# Patient Record
Sex: Female | Born: 1960 | Race: White | Hispanic: No | Smoking: Former smoker
Health system: Southern US, Community
[De-identification: ages and names within clinical notes are randomized; demographics above are authoritative.]

## PROBLEM LIST (undated history)

## (undated) DIAGNOSIS — I10 Essential (primary) hypertension: Secondary | ICD-10-CM

## (undated) DIAGNOSIS — E119 Type 2 diabetes mellitus without complications: Secondary | ICD-10-CM

## (undated) DIAGNOSIS — E079 Disorder of thyroid, unspecified: Secondary | ICD-10-CM

## (undated) HISTORY — PX: CHOLECYSTECTOMY: SHX55

## (undated) HISTORY — PX: ABDOMINAL HYSTERECTOMY: SHX81

---

## 2000-07-18 ENCOUNTER — Emergency Department (HOSPITAL_COMMUNITY): Admission: EM | Admit: 2000-07-18 | Discharge: 2000-07-19 | Payer: Self-pay | Admitting: *Deleted

## 2000-07-19 ENCOUNTER — Encounter: Payer: Self-pay | Admitting: *Deleted

## 2000-07-20 ENCOUNTER — Inpatient Hospital Stay (HOSPITAL_COMMUNITY): Admission: EM | Admit: 2000-07-20 | Discharge: 2000-07-23 | Payer: Self-pay | Admitting: *Deleted

## 2000-07-20 ENCOUNTER — Encounter: Payer: Self-pay | Admitting: *Deleted

## 2000-07-20 ENCOUNTER — Encounter: Payer: Self-pay | Admitting: General Surgery

## 2000-07-21 ENCOUNTER — Encounter: Payer: Self-pay | Admitting: General Surgery

## 2002-04-11 ENCOUNTER — Emergency Department (HOSPITAL_COMMUNITY): Admission: EM | Admit: 2002-04-11 | Discharge: 2002-04-11 | Payer: Self-pay | Admitting: Emergency Medicine

## 2003-03-12 ENCOUNTER — Emergency Department (HOSPITAL_COMMUNITY): Admission: EM | Admit: 2003-03-12 | Discharge: 2003-03-12 | Payer: Self-pay | Admitting: Emergency Medicine

## 2003-07-18 ENCOUNTER — Emergency Department (HOSPITAL_COMMUNITY): Admission: EM | Admit: 2003-07-18 | Discharge: 2003-07-18 | Payer: Self-pay | Admitting: Emergency Medicine

## 2003-09-12 ENCOUNTER — Ambulatory Visit (HOSPITAL_COMMUNITY): Admission: RE | Admit: 2003-09-12 | Discharge: 2003-09-12 | Payer: Self-pay | Admitting: *Deleted

## 2003-10-10 ENCOUNTER — Ambulatory Visit (HOSPITAL_COMMUNITY): Admission: RE | Admit: 2003-10-10 | Discharge: 2003-10-10 | Payer: Self-pay | Admitting: *Deleted

## 2003-12-28 ENCOUNTER — Emergency Department (HOSPITAL_COMMUNITY): Admission: EM | Admit: 2003-12-28 | Discharge: 2003-12-28 | Payer: Self-pay

## 2004-01-05 ENCOUNTER — Emergency Department (HOSPITAL_COMMUNITY): Admission: EM | Admit: 2004-01-05 | Discharge: 2004-01-05 | Payer: Self-pay | Admitting: Emergency Medicine

## 2004-03-05 ENCOUNTER — Emergency Department (HOSPITAL_COMMUNITY): Admission: EM | Admit: 2004-03-05 | Discharge: 2004-03-05 | Payer: Self-pay | Admitting: Emergency Medicine

## 2004-08-24 ENCOUNTER — Emergency Department (HOSPITAL_COMMUNITY): Admission: EM | Admit: 2004-08-24 | Discharge: 2004-08-24 | Payer: Self-pay | Admitting: Emergency Medicine

## 2005-02-09 ENCOUNTER — Emergency Department (HOSPITAL_COMMUNITY): Admission: EM | Admit: 2005-02-09 | Discharge: 2005-02-09 | Payer: Self-pay | Admitting: Emergency Medicine

## 2005-08-15 ENCOUNTER — Emergency Department (HOSPITAL_COMMUNITY): Admission: EM | Admit: 2005-08-15 | Discharge: 2005-08-15 | Payer: Self-pay | Admitting: Emergency Medicine

## 2006-09-05 ENCOUNTER — Emergency Department (HOSPITAL_COMMUNITY): Admission: EM | Admit: 2006-09-05 | Discharge: 2006-09-06 | Payer: Self-pay | Admitting: Emergency Medicine

## 2007-05-28 ENCOUNTER — Emergency Department (HOSPITAL_COMMUNITY): Admission: EM | Admit: 2007-05-28 | Discharge: 2007-05-28 | Payer: Self-pay | Admitting: Emergency Medicine

## 2007-06-15 ENCOUNTER — Ambulatory Visit (HOSPITAL_COMMUNITY): Admission: RE | Admit: 2007-06-15 | Discharge: 2007-06-15 | Payer: Self-pay | Admitting: Family Medicine

## 2007-08-15 ENCOUNTER — Emergency Department (HOSPITAL_COMMUNITY): Admission: EM | Admit: 2007-08-15 | Discharge: 2007-08-15 | Payer: Self-pay | Admitting: Emergency Medicine

## 2008-04-22 ENCOUNTER — Emergency Department (HOSPITAL_COMMUNITY): Admission: EM | Admit: 2008-04-22 | Discharge: 2008-04-22 | Payer: Self-pay | Admitting: Emergency Medicine

## 2008-08-04 ENCOUNTER — Emergency Department (HOSPITAL_COMMUNITY): Admission: EM | Admit: 2008-08-04 | Discharge: 2008-08-04 | Payer: Self-pay | Admitting: Emergency Medicine

## 2008-08-07 ENCOUNTER — Emergency Department (HOSPITAL_COMMUNITY): Admission: EM | Admit: 2008-08-07 | Discharge: 2008-08-07 | Payer: Self-pay | Admitting: Emergency Medicine

## 2008-10-01 ENCOUNTER — Emergency Department (HOSPITAL_COMMUNITY): Admission: EM | Admit: 2008-10-01 | Discharge: 2008-10-01 | Payer: Self-pay | Admitting: Emergency Medicine

## 2008-11-07 ENCOUNTER — Emergency Department (HOSPITAL_COMMUNITY): Admission: EM | Admit: 2008-11-07 | Discharge: 2008-11-08 | Payer: Self-pay | Admitting: Emergency Medicine

## 2009-05-31 ENCOUNTER — Emergency Department (HOSPITAL_COMMUNITY): Admission: EM | Admit: 2009-05-31 | Discharge: 2009-05-31 | Payer: Self-pay | Admitting: Emergency Medicine

## 2009-08-07 ENCOUNTER — Emergency Department (HOSPITAL_COMMUNITY): Admission: EM | Admit: 2009-08-07 | Discharge: 2009-08-07 | Payer: Self-pay | Admitting: Emergency Medicine

## 2009-11-04 ENCOUNTER — Ambulatory Visit (HOSPITAL_COMMUNITY): Admission: RE | Admit: 2009-11-04 | Discharge: 2009-11-04 | Payer: Self-pay | Admitting: Obstetrics & Gynecology

## 2010-07-21 LAB — CULTURE, ROUTINE-ABSCESS

## 2010-07-26 LAB — URINALYSIS, ROUTINE W REFLEX MICROSCOPIC
Bilirubin Urine: NEGATIVE
Hgb urine dipstick: NEGATIVE
Ketones, ur: NEGATIVE mg/dL
Nitrite: NEGATIVE
Specific Gravity, Urine: >1.03 — ABNORMAL HIGH (ref 1.005–1.030)
Urobilinogen, UA: 0.2 mg/dL (ref 0.0–1.0)
pH: 5.5 (ref 5.0–8.0)

## 2010-07-26 LAB — URINE CULTURE
Colony Count: NO GROWTH
Culture: NO GROWTH

## 2010-07-26 LAB — URINE MICROSCOPIC-ADD ON

## 2011-07-31 ENCOUNTER — Emergency Department (HOSPITAL_COMMUNITY)
Admission: EM | Admit: 2011-07-31 | Discharge: 2011-07-31 | Disposition: A | Discharge status: Home / Self Care | Payer: Medicare Other | Attending: Emergency Medicine | Admitting: Emergency Medicine

## 2011-07-31 ENCOUNTER — Encounter (HOSPITAL_COMMUNITY): Payer: Self-pay

## 2011-07-31 DIAGNOSIS — R51 Headache: Secondary | ICD-10-CM

## 2011-07-31 DIAGNOSIS — R059 Cough, unspecified: Secondary | ICD-10-CM | POA: Insufficient documentation

## 2011-07-31 DIAGNOSIS — R05 Cough: Secondary | ICD-10-CM | POA: Insufficient documentation

## 2011-07-31 DIAGNOSIS — G43909 Migraine, unspecified, not intractable, without status migrainosus: Secondary | ICD-10-CM | POA: Insufficient documentation

## 2011-07-31 HISTORY — DX: Disorder of thyroid, unspecified: E07.9

## 2011-07-31 MED ORDER — HYDROCOD POLST-CHLORPHEN POLST 10-8 MG/5ML PO LQCR
5.0000 mL | Freq: Once | ORAL | Status: AC
  Administered 2011-07-31: 5 mL via ORAL
  Filled 2011-07-31: qty 5

## 2011-07-31 MED ORDER — SODIUM CHLORIDE 0.9 % IV BOLUS (SEPSIS)
1000.0000 mL | Freq: Once | INTRAVENOUS | Status: AC
  Administered 2011-07-31: 1000 mL via INTRAVENOUS

## 2011-07-31 MED ORDER — HYDROCOD POLST-CHLORPHEN POLST 10-8 MG/5ML PO LQCR: 5.0000 mL | Freq: Two times a day (BID) | ORAL | Status: DC | PRN

## 2011-07-31 MED ORDER — ONDANSETRON HCL 4 MG/2ML IJ SOLN
4.0000 mg | Freq: Once | INTRAMUSCULAR | Status: AC
  Administered 2011-07-31: 4 mg via INTRAVENOUS
  Filled 2011-07-31: qty 2

## 2011-07-31 MED ORDER — KETOROLAC TROMETHAMINE 30 MG/ML IJ SOLN
30.0000 mg | Freq: Once | INTRAMUSCULAR | Status: AC
  Administered 2011-07-31: 30 mg via INTRAVENOUS
  Filled 2011-07-31: qty 1

## 2011-07-31 MED ORDER — METOCLOPRAMIDE HCL 5 MG/ML IJ SOLN
10.0000 mg | Freq: Once | INTRAMUSCULAR | Status: AC
  Administered 2011-07-31: 10 mg via INTRAVENOUS
  Filled 2011-07-31: qty 2

## 2011-07-31 NOTE — ED Notes (Signed)
Migraine since Friday, vomiting off/on, cough since yesterday, unable to catch breath at times today.  nad in triage

## 2011-07-31 NOTE — ED Notes (Signed)
Patient states she woke up with a migraine on Friday; states took her medications and her migraine medication on Friday.  States vomited several times on Friday.  States she felt ok yesterday, but today developed a cough; states productive cough with yellow phlegm.  A&O; skin w/d. Respirations even and unlabored; able to speak in complete sentences without difficulty.

## 2011-07-31 NOTE — ED Notes (Signed)
Discharge instructions given and reviewed with patient.  Prescription given for Tussionex; effects and use explained.  Patient verbalized understanding of sedating effects of medications.  Patient ambulatory; discharged home in good condition in care of significant other.

## 2011-07-31 NOTE — ED Provider Notes (Addendum)
History     CSN: [id number]  Arrival date & time 07/31/11  [place]   First MD Initiated Contact with Patient 07/31/11 2016      Chief Complaint  Patient presents with  . Migraine  . Cough    (Consider location/radiation/quality/duration/timing/severity/associated sxs/prior treatment) HPI.Marland Kitchen SUBJECTIVE:  Gina Osborne is a 51 y.o. female . She complains of migraine headache intermittently since Friday. Symptoms improved and Saturday but returned today.  Associated symptoms nausea and vomiting. Has a cough. No fever, chills, neuro deficits. This feels like her normal migraine  Current Facility-Administered Medications  Medication Dose Route Frequency Provider Last Rate Last Dose  . chlorpheniramine-HYDROcodone (TUSSIONEX) 10-8 MG/5ML suspension 5 mL  5 mL Oral Once Donnetta Hutching, MD   5 mL at 07/31/11 2126  . ketorolac (TORADOL) 30 MG/ML injection 30 mg  30 mg Intravenous Once Donnetta Hutching, MD   30 mg at 07/31/11 2127  . metoCLOPramide (REGLAN) injection 10 mg  10 mg Intravenous Once Donnetta Hutching, MD   10 mg at 07/31/11 2127  . ondansetron (ZOFRAN) injection 4 mg  4 mg Intravenous Once Donnetta Hutching, MD   4 mg at 07/31/11 2127  . sodium chloride 0.9 % bolus 1,000 mL  1,000 mL Intravenous Once Donnetta Hutching, MD   1,000 mL at 07/31/11 2125   Current Outpatient Prescriptions  Medication Sig Dispense Refill  . chlorpheniramine-HYDROcodone (TUSSIONEX PENNKINETIC ER) 10-8 MG/5ML LQCR Take 5 mLs by mouth every 12 (twelve) hours as needed.  120 mL  0    There are no associated abnormal neurological symptoms such as TIA's, loss of balance, loss of vision or speech, numbness or weakness on review. Past neurological history: negative for stroke, MS, epilepsy, or brain tumor.   OBJECTIVE:  Patient appears in pain, preferring to lie in a darkened room. Her vitals are normal. Alert and oriented x 3.  Ears and throat normal. Neck fully supple without nodes. Sinuses non tender. Cranial nerves are normal.  Fundi are  normal with sharp disc margins, no papilledema, hemorrhages or exudates noted. DTR's normal and symmetric. Babinski sign absent.  Mental status normal. Cerebellar function normal.   ASSESSMENT:  Migraine headache  PLAN:  Treatment today -  see orders as documented in the electronic medical record. ROV prn if pain does not resolve after treatment. Headache intermittently since Friday.  Past Medical History  Diagnosis Date  . Migraine   . Thyroid disease     Past Surgical History  Procedure Date  . Abdominal hysterectomy     No family history on file.  History  Substance Use Topics  . Smoking status: Never Smoker   . Smokeless tobacco: Not on file  . Alcohol Use: No    OB History    Grav Para Term Preterm Abortions TAB SAB Ect Mult Living                  Review of Systems  All other systems reviewed and are negative.    Allergies  Review of patient's allergies indicates not on file.  Home Medications   Current Outpatient Rx  Name Route Sig Dispense Refill  . HYDROCOD POLST-CPM POLST ER 10-8 MG/5ML PO LQCR Oral Take 5 mLs by mouth every 12 (twelve) hours as needed. 120 mL 0    BP 143/88  Pulse 64  Temp(Src) 97.6 F (36.4 C) (Oral)  Resp 20  Ht 5\' 3"  (1.6 m)  Wt 165 lb (74.844 kg)  BMI 29.23 kg/m2  SpO2  100%  Physical Exam  Nursing note and vitals reviewed. Constitutional: She is oriented to person, place, and time. She appears well-developed and well-nourished.  HENT:  Head: Normocephalic and atraumatic.  Eyes: Conjunctivae and EOM are normal. Pupils are equal, round, and reactive to light.  Neck: Normal range of motion. Neck supple.  Cardiovascular: Normal rate and regular rhythm.   Pulmonary/Chest: Effort normal and breath sounds normal.  Abdominal: Soft. Bowel sounds are normal.  Musculoskeletal: Normal range of motion.  Neurological: She is alert and oriented to person, place, and time.  Skin: Skin is warm and dry.  Psychiatric: She has a  normal mood and affect.    ED Course  Procedures (including critical care time)  Labs Reviewed - No data to display No results found.   1. Headache   2. Cough       MDM  IV fluids, medications for pain and nausea.   Recheck at 2200: Feeling much better. Discharge home with Tussionex     Donnetta Hutching, MD 07/31/11 2209  Donnetta Hutching, MD 07/31/11 2212

## 2011-07-31 NOTE — Discharge Instructions (Signed)
Increase fluids. Take your normal medications. Prescription for cough medicine for

## 2012-06-24 ENCOUNTER — Emergency Department (HOSPITAL_COMMUNITY)
Admission: EM | Admit: 2012-06-24 | Discharge: 2012-06-24 | Disposition: A | Discharge status: Home / Self Care | Payer: Medicare Other | Attending: Emergency Medicine | Admitting: Emergency Medicine

## 2012-06-24 ENCOUNTER — Encounter (HOSPITAL_COMMUNITY): Payer: Self-pay | Admitting: Emergency Medicine

## 2012-06-24 DIAGNOSIS — G43909 Migraine, unspecified, not intractable, without status migrainosus: Secondary | ICD-10-CM | POA: Insufficient documentation

## 2012-06-24 DIAGNOSIS — E079 Disorder of thyroid, unspecified: Secondary | ICD-10-CM | POA: Insufficient documentation

## 2012-06-24 DIAGNOSIS — B0239 Other herpes zoster eye disease: Secondary | ICD-10-CM | POA: Insufficient documentation

## 2012-06-24 DIAGNOSIS — R21 Rash and other nonspecific skin eruption: Secondary | ICD-10-CM | POA: Insufficient documentation

## 2012-06-24 DIAGNOSIS — B0230 Zoster ocular disease, unspecified: Secondary | ICD-10-CM

## 2012-06-24 DIAGNOSIS — Z79899 Other long term (current) drug therapy: Secondary | ICD-10-CM | POA: Insufficient documentation

## 2012-06-24 MED ORDER — VALACYCLOVIR HCL 500 MG PO TABS: 1000.0000 mg | ORAL_TABLET | Freq: Every day | ORAL | Status: DC

## 2012-06-24 MED ORDER — VALACYCLOVIR HCL 1 G PO TABS: 1000.0000 mg | ORAL_TABLET | Freq: Every day | ORAL | Status: DC

## 2012-06-24 MED ORDER — VALACYCLOVIR HCL 500 MG PO TABS
1000.0000 mg | ORAL_TABLET | Freq: Once | ORAL | Status: AC
  Administered 2012-06-24: 1000 mg via ORAL
  Filled 2012-06-24 (×2): qty 2

## 2012-06-24 MED ORDER — OXYCODONE-ACETAMINOPHEN 5-325 MG PO TABS
1.0000 | ORAL_TABLET | Freq: Once | ORAL | Status: AC
  Administered 2012-06-24: 1 via ORAL
  Filled 2012-06-24: qty 1

## 2012-06-24 MED ORDER — OXYCODONE-ACETAMINOPHEN 5-325 MG PO TABS: 1.0000 | ORAL_TABLET | ORAL | Status: DC | PRN

## 2012-06-24 MED ORDER — TETRACAINE HCL 0.5 % OP SOLN
1.0000 [drp] | Freq: Once | OPHTHALMIC | Status: AC
  Administered 2012-06-24: 1 [drp] via OPHTHALMIC
  Filled 2012-06-24: qty 2

## 2012-06-24 NOTE — ED Notes (Signed)
Pt c/o red bump and swelling over left eye since Friday. Denies visual changes.

## 2012-06-24 NOTE — ED Provider Notes (Signed)
History     CSN: [id number]  Arrival date & time 06/24/12  1403   First MD Initiated Contact with Patient 06/24/12 1526      Chief Complaint  Patient presents with  . Abscess    (Consider location/radiation/quality/duration/timing/severity/associated sxs/prior treatment) HPI Comments: Gina Osborne is a 52 y.o. Female with painful rash above her left eye which broke out 2 days ago, although she describes having pain across her forehead for a few days prior to the onset of the rash.  It is described as burning and pain,  Constant without radiation of pain.  The rash looked just like red patches but today she noticed blisters forming within the reddened areas.  She denies fevers and chills and has had no visual disturbance and no eye pain.  She has taken no pain relievers or applied any topical medicines to the area.      The history is provided by the patient and the spouse.    Past Medical History  Diagnosis Date  . Migraine   . Thyroid disease     Past Surgical History  Procedure Laterality Date  . Abdominal hysterectomy      No family history on file.  History  Substance Use Topics  . Smoking status: Never Smoker   . Smokeless tobacco: Not on file  . Alcohol Use: No    OB History   Grav Para Term Preterm Abortions TAB SAB Ect Mult Living                  Review of Systems  Constitutional: Negative for fever and chills.  HENT: Negative for facial swelling.   Respiratory: Negative for shortness of breath and wheezing.   Skin: Positive for rash.  Neurological: Negative for numbness.    Allergies  Darvocet; Influenza vaccines; Mushroom ext cmplx(shiitake-reishi-mait); and Chocolate  Home Medications   Current Outpatient Rx  Name  Route  Sig  Dispense  Refill  . albuterol (PROVENTIL) 2 MG tablet   Oral   Take 2 mg by mouth 3 (three) times daily.         Marland Kitchen levothyroxine (SYNTHROID, LEVOTHROID) 50 MCG tablet   Oral   Take 50 mcg by mouth at bedtime.           . phentermine (ADIPEX-P) 37.5 MG tablet   Oral   Take 37.5 mg by mouth daily.         . rizatriptan (MAXALT-MLT) 10 MG disintegrating tablet   Oral   Take 10 mg by mouth as needed for migraine. May repeat in 2 hours if needed         . topiramate (TOPAMAX) 100 MG tablet   Oral   Take 100 mg by mouth at bedtime.         Marland Kitchen zolpidem (AMBIEN) 5 MG tablet   Oral   Take 5 mg by mouth at bedtime.         Marland Kitchen oxyCODONE-acetaminophen (PERCOCET/ROXICET) 5-325 MG per tablet   Oral   Take 1 tablet by mouth every 4 (four) hours as needed for pain.   20 tablet   0   . valACYclovir (VALTREX) 1000 MG tablet   Oral   Take 1 tablet (1,000 mg total) by mouth 5 (five) times daily.   45 tablet   0     BP 115/82  Pulse 76  Temp(Src) 97.9 F (36.6 C) (Oral)  Resp 17  Ht 5\' 4"  (1.626 m)  Wt 160 lb (72.576  kg)  BMI 27.45 kg/m2  SpO2 100%  Physical Exam  Constitutional: She appears well-developed and well-nourished. No distress.  HENT:  Head: Normocephalic.  Eyes: EOM and lids are normal. Pupils are equal, round, and reactive to light. Right eye exhibits no discharge and no exudate. Left eye exhibits no discharge and no exudate.  Slit lamp exam:      The left eye shows fluorescein uptake.  Small focus of prominent scleral blood vessels laterally.  Possible early dendrite medial cornea, vertical at 3 oclock position.  No hutchinsons sign.  Neck: Neck supple.  Cardiovascular: Normal rate.   Pulmonary/Chest: Effort normal. She has no wheezes.  Musculoskeletal: Normal range of motion. She exhibits no edema.  Skin: Rash noted.  Approximate dime sized slightly raised erythematous macule at lateral left eyebrow with several intact small vesicles.  Smaller macule medial eyebrow.  Skin just above the brow slightly erythematous without overt rash.    ED Course  Procedures (including critical care time)  Labs Reviewed - No data to display No results found.   1. Herpes zoster  ophthalmicus     Slit lamp exam was performed by Dr Hyacinth Meeker.  MDM  Spoke with Dr. Lita Mains,  Will see pt in his office tomorrow.  Pt to call for appt.  Time.  Recommended starting her on valcyclovir oral.  He will add valcyclovir ophth drops tomorrow if needed.  Pt was given valacyclovir 1 gram prior to dc home.  She was also given another valcyclovir 1 gram to be taken at home before bedtime as her pharmacy is currently closed.  Also prescribed oxycodone for pain relief.        Burgess Amor, PA-C 06/24/12 1713  Burgess Amor, PA-C 06/26/12 [phone]

## 2012-06-27 NOTE — ED Provider Notes (Signed)
Pt with rash on the L face, on my exam c/w herpes, on my exam with tetracaine and slit lamp has corneal abnormlaity could be c/w zoster opthalmicus - PA Idol d/w optho - will see next day  Medical screening examination/treatment/procedure(s) were conducted as a shared visit with non-physician practitioner(s) and myself.  I personally evaluated the patient during the encounter    Vida Roller, MD 06/27/12 [phone]

## 2013-07-17 ENCOUNTER — Telehealth: Payer: Self-pay

## 2013-07-17 NOTE — Telephone Encounter (Signed)
Pt stated that she did not want to have a TCS. She was referred by Dr. Sherwood GamblerFusco

## 2013-07-17 NOTE — Telephone Encounter (Signed)
Letter to PCP that pt does not wish to schedule colonoscopy at this time.

## 2013-07-17 NOTE — Telephone Encounter (Signed)
Open in error

## 2014-02-28 ENCOUNTER — Other Ambulatory Visit (HOSPITAL_COMMUNITY): Payer: Self-pay | Admitting: Internal Medicine

## 2014-02-28 DIAGNOSIS — Z1231 Encounter for screening mammogram for malignant neoplasm of breast: Secondary | ICD-10-CM

## 2014-03-05 ENCOUNTER — Ambulatory Visit (HOSPITAL_COMMUNITY)
Admission: RE | Admit: 2014-03-05 | Discharge: 2014-03-05 | Disposition: A | Discharge status: Home / Self Care | Payer: Medicare HMO | Source: Ambulatory Visit | Attending: Internal Medicine | Admitting: Internal Medicine

## 2014-03-05 DIAGNOSIS — Z1231 Encounter for screening mammogram for malignant neoplasm of breast: Secondary | ICD-10-CM

## 2014-05-30 DIAGNOSIS — E063 Autoimmune thyroiditis: Secondary | ICD-10-CM | POA: Diagnosis not present

## 2014-05-30 DIAGNOSIS — E119 Type 2 diabetes mellitus without complications: Secondary | ICD-10-CM | POA: Diagnosis not present

## 2014-05-30 DIAGNOSIS — Z6829 Body mass index (BMI) 29.0-29.9, adult: Secondary | ICD-10-CM | POA: Diagnosis not present

## 2014-05-30 DIAGNOSIS — J45909 Unspecified asthma, uncomplicated: Secondary | ICD-10-CM | POA: Diagnosis not present

## 2014-07-02 DIAGNOSIS — H5210 Myopia, unspecified eye: Secondary | ICD-10-CM | POA: Diagnosis not present

## 2014-07-02 DIAGNOSIS — E119 Type 2 diabetes mellitus without complications: Secondary | ICD-10-CM | POA: Diagnosis not present

## 2014-07-24 DIAGNOSIS — E119 Type 2 diabetes mellitus without complications: Secondary | ICD-10-CM | POA: Diagnosis not present

## 2014-08-15 DIAGNOSIS — G43909 Migraine, unspecified, not intractable, without status migrainosus: Secondary | ICD-10-CM | POA: Diagnosis not present

## 2014-08-15 DIAGNOSIS — R1310 Dysphagia, unspecified: Secondary | ICD-10-CM | POA: Diagnosis not present

## 2014-08-15 DIAGNOSIS — G4700 Insomnia, unspecified: Secondary | ICD-10-CM | POA: Diagnosis not present

## 2014-08-15 DIAGNOSIS — E063 Autoimmune thyroiditis: Secondary | ICD-10-CM | POA: Diagnosis not present

## 2014-08-15 DIAGNOSIS — Z6829 Body mass index (BMI) 29.0-29.9, adult: Secondary | ICD-10-CM | POA: Diagnosis not present

## 2014-08-15 DIAGNOSIS — E119 Type 2 diabetes mellitus without complications: Secondary | ICD-10-CM | POA: Diagnosis not present

## 2014-08-15 DIAGNOSIS — E663 Overweight: Secondary | ICD-10-CM | POA: Diagnosis not present

## 2014-08-15 DIAGNOSIS — Z23 Encounter for immunization: Secondary | ICD-10-CM | POA: Diagnosis not present

## 2014-08-18 ENCOUNTER — Other Ambulatory Visit (HOSPITAL_COMMUNITY): Payer: Self-pay | Admitting: Internal Medicine

## 2014-08-18 DIAGNOSIS — R142 Eructation: Secondary | ICD-10-CM

## 2014-08-20 ENCOUNTER — Ambulatory Visit (HOSPITAL_COMMUNITY)
Admission: RE | Admit: 2014-08-20 | Discharge: 2014-08-20 | Disposition: A | Discharge status: Home / Self Care | Payer: Commercial Managed Care - HMO | Source: Ambulatory Visit | Attending: Internal Medicine | Admitting: Internal Medicine

## 2014-08-20 DIAGNOSIS — K449 Diaphragmatic hernia without obstruction or gangrene: Secondary | ICD-10-CM | POA: Insufficient documentation

## 2014-08-20 DIAGNOSIS — R1310 Dysphagia, unspecified: Secondary | ICD-10-CM | POA: Insufficient documentation

## 2014-08-20 DIAGNOSIS — R142 Eructation: Secondary | ICD-10-CM

## 2014-08-21 ENCOUNTER — Other Ambulatory Visit (HOSPITAL_COMMUNITY): Payer: Medicare HMO

## 2014-09-01 DIAGNOSIS — Z6831 Body mass index (BMI) 31.0-31.9, adult: Secondary | ICD-10-CM | POA: Diagnosis not present

## 2014-09-01 DIAGNOSIS — E119 Type 2 diabetes mellitus without complications: Secondary | ICD-10-CM | POA: Diagnosis not present

## 2014-09-01 DIAGNOSIS — F419 Anxiety disorder, unspecified: Secondary | ICD-10-CM | POA: Diagnosis not present

## 2014-09-01 DIAGNOSIS — K219 Gastro-esophageal reflux disease without esophagitis: Secondary | ICD-10-CM | POA: Diagnosis not present

## 2014-09-01 DIAGNOSIS — E6609 Other obesity due to excess calories: Secondary | ICD-10-CM | POA: Diagnosis not present

## 2014-09-01 DIAGNOSIS — R1310 Dysphagia, unspecified: Secondary | ICD-10-CM | POA: Diagnosis not present

## 2014-09-01 DIAGNOSIS — E063 Autoimmune thyroiditis: Secondary | ICD-10-CM | POA: Diagnosis not present

## 2014-09-19 ENCOUNTER — Encounter: Payer: Self-pay | Admitting: Nurse Practitioner

## 2014-09-29 ENCOUNTER — Encounter: Payer: Self-pay | Admitting: Nurse Practitioner

## 2014-09-29 ENCOUNTER — Other Ambulatory Visit: Payer: Self-pay

## 2014-09-29 ENCOUNTER — Ambulatory Visit (INDEPENDENT_AMBULATORY_CARE_PROVIDER_SITE_OTHER): Payer: Commercial Managed Care - HMO | Admitting: Nurse Practitioner

## 2014-09-29 VITALS — BP 123/76 | HR 74 | Temp 97.6°F | Ht 63.0 in | Wt 168.0 lb

## 2014-09-29 DIAGNOSIS — R1310 Dysphagia, unspecified: Secondary | ICD-10-CM | POA: Diagnosis not present

## 2014-09-29 DIAGNOSIS — R933 Abnormal findings on diagnostic imaging of other parts of digestive tract: Secondary | ICD-10-CM | POA: Insufficient documentation

## 2014-09-29 NOTE — Assessment & Plan Note (Signed)
The 54 year old female with solid food and liquid dysphagia. Was set up for a barium pill esophagram by PCP which found 5 cm hiatal hernia, mucosal irregularity just above the hernia, possible ulcerations just above the hernia. She was placed on a PPI as well as Carafate and her symptoms of dysphagia seems to have resolved for now. We'll proceed with EGD to further evaluate for possible esophageal irregularity or changes. Return for follow-up in 3 months  Proceed with EGD with Dr. Darrick Penna in near future: the risks, benefits, and alternatives have been discussed with the patient in detail. The patient states understanding and desires to proceed.  The patient is not on any anticoagulants. She is on Ambien for sleep. We'll provide for 12.5 mg of Phenergan. Denies alcohol or drug use.

## 2014-09-29 NOTE — Patient Instructions (Signed)
1. We will schedule your procedure for you today. 2. Further recommendations to be based on the results of your procedure.

## 2014-09-29 NOTE — Progress Notes (Signed)
cc'd to pcp 

## 2014-09-29 NOTE — Progress Notes (Signed)
Primary Care Physician:  Cassell Smiles., MD Primary Gastroenterologist:  Dr. Darrick Penna  Chief Complaint  Patient presents with  . swallowing issues.    HPI:   54 year old female presents on referral from PCP for BPE abnormality. History of GERD stated well controlled. BPE completed 08/20/14 showed 5 cm hiatal hernia, mucosal irregularity and possible ulcerations in the distal esophagus just above the hernia. No stricture. Is currently on Protonix 40 mg daily and Carafate. Was referred this year for TCS which the patient refused per telephone note.  Today she states she doesn't think she has a history of GERD, but does have a history of dysphagia which she has had for about 2 months but feels it has resolved. Symptoms were with solid food, denies pill dysphagia. Also has symptoms of liquids "feeling like they're going to bubble back up." Has been taking Protonix and carafate with symptoms improvement. Denies abdominal pain, N/V, hematemesis, melena, hematochezia. Has a bowel movement daily which is consistent with [facility] Scale 4. Denies chest pain, dyspnea, dizziness, lightheadedness, syncope, near syncope. Denies any other upper or lower GI symptoms.   Past Medical History  Diagnosis Date  . Migraine   . Thyroid disease     Past Surgical History  Procedure Laterality Date  . Abdominal hysterectomy      Current Outpatient Prescriptions  Medication Sig Dispense Refill  . atorvastatin (LIPITOR) 10 MG tablet     . CARAFATE 1 GM/10ML suspension     . Cetirizine HCl 10 MG CAPS Take by mouth.    . levothyroxine (SYNTHROID, LEVOTHROID) 50 MCG tablet Take 50 mcg by mouth at bedtime.     Marland Kitchen lisinopril (PRINIVIL,ZESTRIL) 2.5 MG tablet     . pantoprazole (PROTONIX) 40 MG tablet     . phentermine (ADIPEX-P) 37.5 MG tablet Take 37.5 mg by mouth daily.    Marland Kitchen topiramate (TOPAMAX) 100 MG tablet Take 100 mg by mouth at bedtime.    Marland Kitchen zolpidem (AMBIEN) 5 MG tablet Take 5 mg by mouth at bedtime.      Marland Kitchen albuterol (PROVENTIL) 2 MG tablet Take 2 mg by mouth 3 (three) times daily.    Marland Kitchen oxyCODONE-acetaminophen (PERCOCET/ROXICET) 5-325 MG per tablet Take 1 tablet by mouth every 4 (four) hours as needed for pain. (Patient not taking: Reported on 09/29/2014) 20 tablet 0  . rizatriptan (MAXALT-MLT) 10 MG disintegrating tablet Take 10 mg by mouth as needed for migraine. May repeat in 2 hours if needed    . valACYclovir (VALTREX) 1000 MG tablet Take 1 tablet (1,000 mg total) by mouth 5 (five) times daily. (Patient not taking: Reported on 09/29/2014) 45 tablet 0   No current facility-administered medications for this visit.    Allergies as of 09/29/2014 - Review Complete 09/29/2014  Allergen Reaction Noted  . Darvocet [propoxyphene n-acetaminophen]  08/04/2011  . Influenza vaccines  08/04/2011  . Mushroom ext cmplx(shiitake-reishi-mait) Hives and Swelling 08/04/2011  . Chocolate Rash 08/04/2011    No family history on file.  History   Social History  . Marital Status: Single    Spouse Name: N/A  . Number of Children: N/A  . Years of Education: N/A   Occupational History  . Not on file.   Social History Main Topics  . Smoking status: Never Smoker   . Smokeless tobacco: Not on file  . Alcohol Use: No  . Drug Use: No  . Sexual Activity: Yes    Birth Control/ Protection: None   Other Topics  Concern  . Not on file   Social History Narrative    Review of Systems: General: Negative for anorexia, weight loss, fever, chills, fatigue, weakness. Eyes: Negative for vision changes.  ENT: Negative for hoarseness, difficulty swallowing. CV: Negative for chest pain, angina, palpitations, peripheral edema.  Respiratory: Negative for dyspnea at rest, cough, wheezing.  GI: See history of present illness. Derm: Negative for rash or itching.  Endo: Negative for unusual weight change.  Heme: Negative for bruising or bleeding. Allergy: Negative for rash or hives.    Physical Exam: BP  123/76 mmHg  Pulse 74  Temp(Src) 97.6 F (36.4 C)  Ht 5\' 3"  (1.6 m)  Wt 168 lb (76.204 kg)  BMI 29.77 kg/m2 General:   Alert and oriented. Pleasant and cooperative. Well-nourished and well-developed.  Head:  Normocephalic and atraumatic. Eyes:  Without icterus, sclera clear and conjunctiva pink.  Ears:  Normal auditory acuity. Cardiovascular:  S1, S2 present without murmurs appreciated. Normal pulses noted. Extremities without clubbing or edema. Respiratory:  Clear to auscultation bilaterally. No wheezes, rales, or rhonchi. No distress.  Gastrointestinal:  +BS, soft, non-tender and non-distended. No HSM noted. No guarding or rebound. No masses appreciated.  Rectal:  Deferred  Neurologic:  Alert and oriented x4;  grossly normal neurologically. Psych:  Alert and cooperative. Normal mood and affect. Heme/Lymph/Immune: No excessive bruising noted.    09/29/2014 9:32 AM

## 2014-09-29 NOTE — Assessment & Plan Note (Signed)
54 year old female with a history of dysphagia of solid foods and occasionally liquids. Patient had a barium pill esophagram completed which shows mucosal irregularity just above the 5 cm hiatal hernia along with the possibility of erosions at the same spot. Patient was placed on PPI as well as Carafate and symptoms have subsequently seemed to resolve. Given irregularities found on BPE we'll also proceed with an endoscopy with possible dilation to further evaluate for possibilities of mucosal irregularity and silent GERD ramifications. Return for further evaluation in 3 months.  Proceed with EGD with Dr. Darrick Penna in near future: the risks, benefits, and alternatives have been discussed with the patient in detail. The patient states understanding and desires to proceed.  The patient is not on any anticoagulants. She is on Ambien for sleep. We'll provide for 12.5 mg of Phenergan. Denies alcohol or drug use.

## 2014-10-15 NOTE — Progress Notes (Signed)
Pt was going to call us back to set up her EGD+/- dil but has never called us back.

## 2014-10-17 ENCOUNTER — Other Ambulatory Visit: Payer: Self-pay

## 2014-10-17 DIAGNOSIS — R1314 Dysphagia, pharyngoesophageal phase: Secondary | ICD-10-CM

## 2014-10-31 ENCOUNTER — Ambulatory Visit (HOSPITAL_COMMUNITY)
Admission: RE | Admit: 2014-10-31 | Discharge: 2014-10-31 | Disposition: A | Discharge status: Home / Self Care | Payer: Commercial Managed Care - HMO | Source: Ambulatory Visit | Attending: Gastroenterology | Admitting: Gastroenterology

## 2014-10-31 ENCOUNTER — Encounter (HOSPITAL_COMMUNITY)
Admission: RE | Disposition: A | Discharge status: Home / Self Care | Payer: Self-pay | Source: Ambulatory Visit | Attending: Gastroenterology

## 2014-10-31 ENCOUNTER — Encounter (HOSPITAL_COMMUNITY): Payer: Self-pay

## 2014-10-31 DIAGNOSIS — K2950 Unspecified chronic gastritis without bleeding: Secondary | ICD-10-CM | POA: Insufficient documentation

## 2014-10-31 DIAGNOSIS — R1314 Dysphagia, pharyngoesophageal phase: Secondary | ICD-10-CM

## 2014-10-31 DIAGNOSIS — G43909 Migraine, unspecified, not intractable, without status migrainosus: Secondary | ICD-10-CM | POA: Insufficient documentation

## 2014-10-31 DIAGNOSIS — K2970 Gastritis, unspecified, without bleeding: Secondary | ICD-10-CM | POA: Diagnosis not present

## 2014-10-31 DIAGNOSIS — E079 Disorder of thyroid, unspecified: Secondary | ICD-10-CM | POA: Diagnosis not present

## 2014-10-31 DIAGNOSIS — R1310 Dysphagia, unspecified: Secondary | ICD-10-CM

## 2014-10-31 DIAGNOSIS — K222 Esophageal obstruction: Secondary | ICD-10-CM | POA: Diagnosis not present

## 2014-10-31 DIAGNOSIS — K449 Diaphragmatic hernia without obstruction or gangrene: Secondary | ICD-10-CM | POA: Insufficient documentation

## 2014-10-31 DIAGNOSIS — Z79899 Other long term (current) drug therapy: Secondary | ICD-10-CM | POA: Diagnosis not present

## 2014-10-31 HISTORY — PX: SAVORY DILATION: SHX5439

## 2014-10-31 HISTORY — PX: ESOPHAGOGASTRODUODENOSCOPY: SHX5428

## 2014-10-31 SURGERY — EGD (ESOPHAGOGASTRODUODENOSCOPY)
Anesthesia: Moderate Sedation

## 2014-10-31 MED ORDER — MINERAL OIL PO OIL
TOPICAL_OIL | ORAL | Status: AC
  Filled 2014-10-31: qty 30

## 2014-10-31 MED ORDER — MEPERIDINE HCL 100 MG/ML IJ SOLN
INTRAMUSCULAR | Status: AC
  Filled 2014-10-31: qty 2

## 2014-10-31 MED ORDER — PROMETHAZINE HCL 25 MG/ML IJ SOLN: 12.5000 mg | Freq: Once | INTRAMUSCULAR | Status: DC

## 2014-10-31 MED ORDER — MIDAZOLAM HCL 5 MG/5ML IJ SOLN
INTRAMUSCULAR | Status: DC | PRN
  Administered 2014-10-31: 1 mg via INTRAVENOUS
  Administered 2014-10-31 (×2): 2 mg via INTRAVENOUS
  Administered 2014-10-31: 1 mg via INTRAVENOUS

## 2014-10-31 MED ORDER — MIDAZOLAM HCL 5 MG/5ML IJ SOLN
INTRAMUSCULAR | Status: AC
  Filled 2014-10-31: qty 10

## 2014-10-31 MED ORDER — LIDOCAINE VISCOUS 2 % MT SOLN
OROMUCOSAL | Status: AC
  Filled 2014-10-31: qty 15

## 2014-10-31 MED ORDER — LIDOCAINE VISCOUS 2 % MT SOLN
OROMUCOSAL | Status: DC | PRN
  Administered 2014-10-31: 4 mL via OROMUCOSAL

## 2014-10-31 MED ORDER — PANTOPRAZOLE SODIUM 40 MG PO TBEC: DELAYED_RELEASE_TABLET | ORAL | Status: DC

## 2014-10-31 MED ORDER — SODIUM CHLORIDE 0.9 % IV SOLN: INTRAVENOUS | Status: DC

## 2014-10-31 MED ORDER — MEPERIDINE HCL 100 MG/ML IJ SOLN
INTRAMUSCULAR | Status: DC | PRN
  Administered 2014-10-31: 50 mg via INTRAVENOUS
  Administered 2014-10-31 (×2): 25 mg via INTRAVENOUS

## 2014-10-31 NOTE — Op Note (Signed)
[facility] [address] [place], [address]   ENDOSCOPY PROCEDURE REPORT  PATIENT: Gina Osborne, Gina Osborne  MR#: [id number] BIRTHDATE: [birth year] , 53  yrs. old GENDER: female  ENDOSCOPIST: West Bali, MD REFFERED [id number] Sherwood Gambler, M.D.  PROCEDURE DATE:  11-06-14 PROCEDURE:   EGD with dilatation over guidewire and EGD with biopsy   INDICATIONS:1.  dysphagia. MEDICATIONS: Demerol 100 mg IV and Versed 6 mg IV TOPICAL ANESTHETIC: Viscous Xylocaine  DESCRIPTION OF PROCEDURE:   After the risks benefits and alternatives of the procedure were thoroughly explained, informed consent was obtained.  The EG-[id number] ([id number])  endoscope was introduced through the mouth and advanced to the second portion of the duodenum. The instrument was slowly withdrawn as the mucosa was carefully examined.  Prior to withdrawal of the scope, the guidwire was placed.  The esophagus was dilated successfully.  The patient was recovered in endoscopy and discharged home in satisfactory condition. Estimated blood loss is zero unless otherwise noted in this procedure report.   ESOPHAGUS: A stricture was found at the gastroesophageal junction. The stenosis was traversable with the endoscope. STOMACH: A hiatal hernia was found.   Mild non-erosive gastritis (inflammation) was found in the gastric body and gastric antrum. Multiple biopsies were performed.   Dilation was then performed at the gastroesphageal junction  Dilator: Savary over guidewire Size(s): 12.8-16 MM Resistance: minimal Heme: none  COMPLICATIONS: There were no immediate complications.  ENDOSCOPIC IMPRESSION: 1.   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   SMALL Hiatal hernia 3.   MILD Non-erosive gastritis  RECOMMENDATIONS: FOLLOW A LOW FAT DIET. AVOID TRIGGERS FOR REFLUX. PROTONIX 30 MINUTES PRIOR TO A MEAL FOREVER. AWAIT BIOPSY RESULTS. FOLLOW UP IN 3 MOS.   eSigned:  West Bali, MD 06-Nov-2014  12:20 PM   CPT CODES: ICD CODES:  The ICD and CPT codes recommended by this software are interpretations from the data that the clinical staff has captured with the software.  The verification of the translation of this report to the ICD and CPT codes and modifiers is the sole responsibility of the health care institution and practicing physician where this report was generated.  [employer] will not be held responsible for the validity of the ICD and CPT codes included on this report.  AMA assumes no liability for data contained or not contained herein. CPT is a Publishing rights manager of the [employer].

## 2014-10-31 NOTE — H&P (Signed)
  Primary Care Physician:  Cassell Smiles., MD Primary Gastroenterologist:  Dr. Darrick Penna  Pre-Procedure History & Physical: HPI:  Gina Osborne is a 54 y.o. female here for DYSPHAGIA/ABNL BPE MAY 2016.  Past Medical History  Diagnosis Date  . Migraine   . Thyroid disease     Past Surgical History  Procedure Laterality Date  . Abdominal hysterectomy      Prior to Admission medications   Medication Sig Start Date End Date Taking? Authorizing Provider  atorvastatin (LIPITOR) 10 MG tablet Take 10 mg by mouth daily.  08/28/14  Yes Historical Provider, MD  Cetirizine HCl 10 MG CAPS Take 1 capsule by mouth daily.    Yes Historical Provider, MD  levothyroxine (SYNTHROID, LEVOTHROID) 50 MCG tablet Take 50 mcg by mouth daily.    Yes Historical Provider, MD  lisinopril (PRINIVIL,ZESTRIL) 2.5 MG tablet Take 2.5 mg by mouth daily.  08/28/14  Yes Historical Provider, MD  pantoprazole (PROTONIX) 40 MG tablet Take 40 mg by mouth daily.  09/01/14  Yes Historical Provider, MD  phentermine (ADIPEX-P) 37.5 MG tablet Take 37.5 mg by mouth every other day.    Yes Historical Provider, MD  topiramate (TOPAMAX) 100 MG tablet Take 100 mg by mouth at bedtime.   Yes Historical Provider, MD  zolpidem (AMBIEN) 5 MG tablet Take 5 mg by mouth at bedtime.   Yes Historical Provider, MD    Allergies as of 10/17/2014 - Review Complete 10/17/2014  Allergen Reaction Noted  . Darvocet [propoxyphene n-acetaminophen]  08/04/2011    No family history on file.  History   Social History  . Marital Status: Single    Spouse Name: N/A  . Number of Children: N/A  . Years of Education: N/A   Occupational History  . Not on file.   Social History Main Topics  . Smoking status: Never Smoker   . Smokeless tobacco: Not on file  . Alcohol Use: No  . Drug Use: No  . Sexual Activity: Yes    Birth Control/ Protection: None   Other Topics Concern  . Not on file   Social History Narrative    Review of Systems: See HPI,  otherwise negative ROS   Physical Exam: There were no vitals taken for this visit. General:   Alert,  pleasant and cooperative in NAD Head:  Normocephalic and atraumatic. Neck:  Supple; Lungs:  Clear throughout to auscultation.    Heart:  Regular rate and rhythm. Abdomen:  Soft, nontender and nondistended. Normal bowel sounds, without guarding, and without rebound.   Neurologic:  Alert and  oriented x4;  grossly normal neurologically.  Impression/Plan:     DYSPHAGIA/ABNL BPE MAY 2016   PLAN:  EGD/DIL TODAY

## 2014-10-31 NOTE — Discharge Instructions (Signed)
I dilated your esophagus. 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 You have a small hiatal hernia. You have gastritis. I biopsied your stomach.   FOLLOW A LOW FAT DIET. SEE INFO BELOW.  AVOID TRIGGERS FOR REFLUX. SEE INFO BELOW.  CONTINUE PROTONIX. TAKE 30 MINUTES PRIOR TO YOUR FIRST MEAL FOREVER.  YOUR BIOPSY RESULTS WILL BE AVAILABLE IN MY CHART AFTER JUL 26 AND MY OFFICE WILL CONTACT YOU IN 10-14 DAYS WITH YOUR RESULTS.   FOLLOW UP IN 3 MOS.   UPPER ENDOSCOPY AFTER CARE Read the instructions outlined below and refer to this sheet in the next week. These discharge instructions provide you with general information on caring for yourself after you leave the hospital. While your treatment has been planned according to the most current medical practices available, unavoidable complications occasionally occur. If you have any problems or questions after discharge, call DR. Maja Mccaffery, [phone].  ACTIVITY  You may resume your regular activity, but move at a slower pace for the next 24 hours.   Take frequent rest periods for the next 24 hours.   Walking will help get rid of the air and reduce the bloated feeling in your belly (abdomen).   No driving for 24 hours (because of the medicine (anesthesia) used during the test).   You may shower.   Do not sign any important legal documents or operate any machinery for 24 hours (because of the anesthesia used during the test).    NUTRITION  Drink plenty of fluids.   You may resume your normal diet as instructed by your doctor.   Begin with a light meal and progress to your normal diet. Heavy or fried foods are harder to digest and may make you feel sick to your stomach (nauseated).   Avoid alcoholic beverages for 24 hours or as instructed.    MEDICATIONS  You may resume your normal medications.   WHAT YOU CAN EXPECT TODAY  Some feelings of bloating in the abdomen.   Passage of more gas than  usual.    IF YOU HAD A BIOPSY TAKEN DURING THE UPPER ENDOSCOPY:  Eat a soft diet IF YOU HAVE NAUSEA, BLOATING, ABDOMINAL PAIN, OR VOMITING.    FINDING OUT THE RESULTS OF YOUR TEST Not all test results are available during your visit. DR. Darrick Penna WILL CALL YOU WITHIN 14 DAYS OF YOUR PROCEDUE WITH YOUR RESULTS. Do not assume everything is normal if you have not heard from DR. Tabrina Esty, CALL HER OFFICE AT [phone].  SEEK IMMEDIATE MEDICAL ATTENTION AND CALL THE OFFICE: [phone] IF:  You have more than a spotting of blood in your stool.   Your belly is swollen (abdominal distention).   You are nauseated or vomiting.   You have a temperature over 101F.   You have abdominal pain or discomfort that is severe or gets worse throughout the day.    Low-Fat Diet BREADS, CEREALS, PASTA, RICE, DRIED PEAS, AND BEANS These products are high in carbohydrates and most are low in fat. Therefore, they can be increased in the diet as substitutes for fatty foods. They too, however, contain calories and should not be eaten in excess. Cereals can be eaten for snacks as well as for breakfast.  Include foods that contain fiber (fruits, vegetables, whole grains, and legumes). Research shows that fiber may lower blood cholesterol levels, especially the water-soluble fiber found in fruits, vegetables, oat products, and legumes. FRUITS AND VEGETABLES It is good to eat fruits  and vegetables. Besides being sources of fiber, both are rich in vitamins and some minerals. They help you get the daily allowances of these nutrients. Fruits and vegetables can be used for snacks and desserts. MEATS Limit lean meat, chicken, [place], and fish to no more than 6 ounces per day. Beef, Pork, and Lamb Use lean cuts of beef, pork, and lamb. Lean cuts include:  Extra-lean ground beef.  Arm roast.  Sirloin tip.  Center-cut ham.  Round steak.  Loin chops.  Rump roast.  Tenderloin.  Trim all fat off the outside of meats  before cooking. It is not necessary to severely decrease the intake of red meat, but lean choices should be made. Lean meat is rich in protein and contains a highly absorbable form of iron. Premenopausal women, in particular, should avoid reducing lean red meat because this could increase the risk for low red blood cells (iron-deficiency anemia).  Chicken and [place] These are good sources of protein. The fat of poultry can be reduced by removing the skin and underlying fat layers before cooking. Chicken and [place] can be substituted for lean red meat in the diet. Poultry should not be fried or covered with high-fat sauces. Fish and Shellfish Fish is a good source of protein. Shellfish contain cholesterol, but they usually are low in saturated fatty acids. The preparation of fish is important. Like chicken and [place], they should not be fried or covered with high-fat sauces. EGGS Egg whites contain no fat or cholesterol. They can be eaten often. Try 1 to 2 egg whites instead of whole eggs in recipes or use egg substitutes that do not contain yolk.  MILK AND DAIRY PRODUCTS Use skim or 1% milk instead of 2% or whole milk. Decrease whole milk, natural, and processed cheeses. Use nonfat or low-fat (2%) cottage cheese or low-fat cheeses made from vegetable oils. Choose nonfat or low-fat (1 to 2%) yogurt. Experiment with evaporated skim milk in recipes that call for heavy cream. Substitute low-fat yogurt or low-fat cottage cheese for sour cream in dips and salad dressings. Have at least 2 servings of low-fat dairy products, such as 2 glasses of skim (or 1%) milk each day to help get your daily calcium intake.  FATS AND OILS Butterfat, lard, and beef fats are high in saturated fat and cholesterol. These should be avoided.Vegetable fats do not contain cholesterol. AVOID coconut oil, palm oil, and palm kernel oil, WHICH are very high in saturated fats. These should be limited. These fats are often used in bakery  goods, processed foods, popcorn, oils, and nondairy creamers. Vegetable shortenings and some peanut butters contain hydrogenated oils, which are also saturated fats. Read the labels on these foods and check for saturated vegetable oils.  Desirable liquid vegetable oils are corn oil, cottonseed oil, olive oil, canola oil, safflower oil, soybean oil, and sunflower oil. Peanut oil is not as good, but small amounts are acceptable. Buy a heart-healthy tub margarine that has no partially hydrogenated oils in the ingredients. AVOID Mayonnaise and salad dressings often are made from unsaturated fats.  OTHER EATING TIPS Snacks  Most sweets should be limited as snacks. They tend to be rich in calories and fats, and their caloric content outweighs their nutritional value. Some good choices in snacks are graham crackers, melba toast, soda crackers, bagels (no egg), English muffins, fruits, and vegetables. These snacks are preferable to snack crackers, [place] fries, and chips. Popcorn should be air-popped or cooked in small amounts of liquid vegetable oil.  Desserts Eat fruit, low-fat yogurt, and fruit ices instead of pastries, cake, and cookies. Sherbet, angel food cake, gelatin dessert, frozen low-fat yogurt, or other frozen products that do not contain saturated fat (pure fruit juice bars, frozen ice pops) are also acceptable.   COOKING METHODS Choose those methods that use little or no fat. They include: Poaching.  Braising.  Steaming.  Grilling.  Baking.  Stir-frying.  Broiling.  Microwaving.  Foods can be cooked in a nonstick pan without added fat, or use a nonfat cooking spray in regular cookware. Limit fried foods and avoid frying in saturated fat. Add moisture to lean meats by using water, broth, cooking wines, and other nonfat or low-fat sauces along with the cooking methods mentioned above. Soups and stews should be chilled after cooking. The fat that forms on top after a few hours in the  refrigerator should be skimmed off. When preparing meals, avoid using excess salt. Salt can contribute to raising blood pressure in some people.  EATING AWAY FROM HOME Order entres, potatoes, and vegetables without sauces or butter. When meat exceeds the size of a deck of cards (3 to 4 ounces), the rest can be taken home for another meal. Choose vegetable or fruit salads and ask for low-calorie salad dressings to be served on the side. Use dressings sparingly. Limit high-fat toppings, such as bacon, crumbled eggs, cheese, sunflower seeds, and olives. Ask for heart-healthy tub margarine instead of butter.   Gastritis  Gastritis is an inflammation (the body's way of reacting to injury and/or infection) of the stomach. It is often caused by viral or bacterial (germ) infections. It can also be caused BY ASPIRIN, BC/GOODY POWDER'S, (IBUPROFEN) MOTRIN, OR ALEVE (NAPROXEN), chemicals (including alcohol), SPICY FOODS, and medications. This illness may be associated with generalized malaise (feeling tired, not well), UPPER ABDOMINAL STOMACH cramps, and fever. One common bacterial cause of gastritis is an organism known as H. Pylori. This can be treated with antibiotics.   Hiatal Hernia A hiatal hernia occurs when a part of the stomach slides above the diaphragm. The diaphragm is the thin muscle separating the belly (abdomen) from the chest. A hiatal hernia can be something you are born with or develop over time. Hiatal hernias may allow stomach acid to flow back into your esophagus, the tube which carries food from your mouth to your stomach. If this acid causes problems it is called GERD (gastro-esophageal reflux disease).   SYMPTOMS Common symptoms of GERD are heartburn (burning in your chest). This is worse when lying down or bending over. It may also cause belching and indigestion. Some of the things which make GERD worse are:  Increased weight pushes on stomach making acid rise more easily.   Smoking  markedly increases acid production.   Alcohol decreases lower esophageal sphincter pressure (valve between stomach and esophagus), allowing acid from stomach into esophagus.   Late evening meals and going to bed with a full stomach increases pressure.   HOME CARE INSTRUCTIONS  Try to achieve and maintain an ideal body weight.   Avoid drinking alcoholic beverages.   DO NOT smokE.   Do not wear tight clothing around your chest or stomach.   Eat smaller meals and eat more frequently. This keeps your stomach from getting too full. Eat slowly.   Do not lie down for 2 or 3 hours after eating. Do not eat or drink anything 1 to 2 hours before going to bed.   Avoid caffeine beverages (colas, coffee, cocoa, tea),  fatty foods, citrus fruits and all other foods and drinks that contain acid and that seem to increase the problems.   Avoid bending over, especially after eating OR STRAINING. Anything that increases the pressure in your belly increases the amount of acid that may be pushed up into your esophagus.    ESOPHAGEAL STRICTURE  Esophageal strictures can be caused by stomach acid backing up into the tube that carries food from the mouth down to the stomach (lower esophagus).  TREATMENT There are a number of  medicines used to treat reflux/stricture, including: Antacids.  Proton-pump inhibitors: PROTONIX   HOME CARE INSTRUCTIONS Eat 2-3 hours before going to bed.  Try to reach and maintain a healthy weight.  Do not eat just a few very large meals. Instead, eat 4 TO 6 smaller meals throughout the day.  Try to identify foods and beverages that make your symptoms worse, and avoid these.  Avoid tight clothing.  Do not exercise right after eating.

## 2014-11-04 ENCOUNTER — Encounter (HOSPITAL_COMMUNITY): Payer: Self-pay | Admitting: Gastroenterology

## 2014-11-04 ENCOUNTER — Telehealth: Payer: Self-pay | Admitting: Gastroenterology

## 2014-11-04 NOTE — Telephone Encounter (Signed)
Please call pt. HER stomach Bx shows mild gastritis.    FOLLOW A LOW FAT DIET.   AVOID TRIGGERS FOR REFLUX.   CONTINUE PROTONIX. TAKE 30 MINUTES PRIOR TO YOUR FIRST MEAL FOREVER.  FOLLOW UP IN 3 MOS E30 DYSPAHGIA/GERD.  PT NEEDS A SCREENING COLONOSCOPY. SHE MAY BE TRIAGED FOR TCS BEFORE HER APPT IF SHE WANTS TO GO AHEAD AND GET IT DONE.

## 2014-11-04 NOTE — Progress Notes (Signed)
REVIEWED-NO ADDITIONAL RECOMMENDATIONS. 

## 2014-11-04 NOTE — Telephone Encounter (Signed)
PATIENT SCHEDULED FOR FU OV °

## 2014-11-05 NOTE — Telephone Encounter (Signed)
PT is aware of results. She does not want colonoscopy now, said her mom is is critical condition. Said she will call when she is ready to do it.

## 2014-11-23 ENCOUNTER — Emergency Department (HOSPITAL_COMMUNITY)
Admission: EM | Admit: 2014-11-23 | Discharge: 2014-11-23 | Disposition: A | Discharge status: Home / Self Care | Payer: Commercial Managed Care - HMO | Attending: Emergency Medicine | Admitting: Emergency Medicine

## 2014-11-23 ENCOUNTER — Encounter (HOSPITAL_COMMUNITY): Payer: Self-pay | Admitting: *Deleted

## 2014-11-23 ENCOUNTER — Emergency Department (HOSPITAL_COMMUNITY): Payer: Commercial Managed Care - HMO

## 2014-11-23 DIAGNOSIS — G43909 Migraine, unspecified, not intractable, without status migrainosus: Secondary | ICD-10-CM | POA: Insufficient documentation

## 2014-11-23 DIAGNOSIS — Z79899 Other long term (current) drug therapy: Secondary | ICD-10-CM | POA: Diagnosis not present

## 2014-11-23 DIAGNOSIS — N132 Hydronephrosis with renal and ureteral calculous obstruction: Secondary | ICD-10-CM | POA: Diagnosis not present

## 2014-11-23 DIAGNOSIS — R1012 Left upper quadrant pain: Secondary | ICD-10-CM | POA: Diagnosis present

## 2014-11-23 DIAGNOSIS — E079 Disorder of thyroid, unspecified: Secondary | ICD-10-CM | POA: Diagnosis not present

## 2014-11-23 DIAGNOSIS — Z9049 Acquired absence of other specified parts of digestive tract: Secondary | ICD-10-CM | POA: Diagnosis not present

## 2014-11-23 DIAGNOSIS — N201 Calculus of ureter: Secondary | ICD-10-CM | POA: Insufficient documentation

## 2014-11-23 DIAGNOSIS — K5730 Diverticulosis of large intestine without perforation or abscess without bleeding: Secondary | ICD-10-CM | POA: Diagnosis not present

## 2014-11-23 LAB — URINALYSIS, ROUTINE W REFLEX MICROSCOPIC
BILIRUBIN URINE: NEGATIVE
GLUCOSE, UA: NEGATIVE mg/dL
Ketones, ur: NEGATIVE mg/dL
NITRITE: NEGATIVE
Protein, ur: NEGATIVE mg/dL
Specific Gravity, Urine: 1.01 (ref 1.005–1.030)
UROBILINOGEN UA: 0.2 mg/dL (ref 0.0–1.0)
pH: 6.5 (ref 5.0–8.0)

## 2014-11-23 LAB — URINE MICROSCOPIC-ADD ON

## 2014-11-23 MED ORDER — NAPROXEN 500 MG PO TABS: ORAL_TABLET | ORAL | Status: DC

## 2014-11-23 MED ORDER — FENTANYL CITRATE (PF) 100 MCG/2ML IJ SOLN
50.0000 ug | Freq: Once | INTRAMUSCULAR | Status: AC
  Administered 2014-11-23: 50 ug via INTRAVENOUS
  Filled 2014-11-23: qty 2

## 2014-11-23 MED ORDER — OXYCODONE-ACETAMINOPHEN 5-325 MG PO TABS: ORAL_TABLET | ORAL | Status: DC

## 2014-11-23 MED ORDER — ONDANSETRON 4 MG PO TBDP
4.0000 mg | ORAL_TABLET | Freq: Three times a day (TID) | ORAL | Status: DC | PRN
Start: 2014-11-23 — End: 2015-06-23

## 2014-11-23 MED ORDER — TAMSULOSIN HCL 0.4 MG PO CAPS: ORAL_CAPSULE | ORAL | Status: DC

## 2014-11-23 MED ORDER — HYDROMORPHONE HCL 1 MG/ML IJ SOLN
1.0000 mg | Freq: Once | INTRAMUSCULAR | Status: AC
  Administered 2014-11-23: 1 mg via INTRAVENOUS
  Filled 2014-11-23: qty 1

## 2014-11-23 MED ORDER — ONDANSETRON HCL 4 MG/2ML IJ SOLN
4.0000 mg | Freq: Once | INTRAMUSCULAR | Status: AC
  Administered 2014-11-23: 4 mg via INTRAVENOUS
  Filled 2014-11-23: qty 2

## 2014-11-23 MED ORDER — SODIUM CHLORIDE 0.9 % IV SOLN
INTRAVENOUS | Status: DC
  Administered 2014-11-23: 05:00:00 via INTRAVENOUS

## 2014-11-23 MED ORDER — KETOROLAC TROMETHAMINE 30 MG/ML IJ SOLN
30.0000 mg | Freq: Once | INTRAMUSCULAR | Status: AC
  Administered 2014-11-23: 30 mg via INTRAVENOUS
  Filled 2014-11-23: qty 1

## 2014-11-23 NOTE — Discharge Instructions (Signed)
Drink plenty of fluids. Take the medications as prescribed. Return to the ED for fever or uncontrolled vomiting or pain. Call [facility] to get an appointment to be seen this week. You have a "4 mm stone in the distal UVJ on the right".    Dietary Guidelines to Help Prevent Kidney Stones Your risk of kidney stones can be decreased by adjusting the foods you eat. The most important thing you can do is drink enough fluid. You should drink enough fluid to keep your urine clear or pale yellow. The following guidelines provide specific information for the type of kidney stone you have had. GUIDELINES ACCORDING TO TYPE OF KIDNEY STONE Calcium Oxalate Kidney Stones  Reduce the amount of salt you eat. Foods that have a lot of salt cause your body to release excess calcium into your urine. The excess calcium can combine with a substance called oxalate to form kidney stones.  Reduce the amount of animal protein you eat if the amount you eat is excessive. Animal protein causes your body to release excess calcium into your urine. Ask your dietitian how much protein from animal sources you should be eating.  Avoid foods that are high in oxalates. If you take vitamins, they should have less than 500 mg of vitamin C. Your body turns vitamin C into oxalates. You do not need to avoid fruits and vegetables high in vitamin C. Calcium Phosphate Kidney Stones  Reduce the amount of salt you eat to help prevent the release of excess calcium into your urine.  Reduce the amount of animal protein you eat if the amount you eat is excessive. Animal protein causes your body to release excess calcium into your urine. Ask your dietitian how much protein from animal sources you should be eating.  Get enough calcium from food or take a calcium supplement (ask your dietitian for recommendations). Food sources of calcium that do not increase your risk of kidney stones include:  Broccoli.  Dairy products, such as cheese  and yogurt.  Pudding. Uric Acid Kidney Stones  Do not have more than 6 oz of animal protein per day. FOOD SOURCES Animal Protein Sources  Meat (all types).  Poultry.  Eggs.  Fish, seafood. Foods High in [employer] seasonings.  Soy sauce.  Teriyaki sauce.  Cured and processed meats.  Salted crackers and snack foods.  Fast food.  Canned soups and most canned foods. Foods High in Oxalates  Grains:  Amaranth.  Barley.  Grits.  Wheat germ.  Bran.  Buckwheat flour.  All bran cereals.  Pretzels.  Whole wheat bread.  Vegetables:  Beans (wax).  Beets and beet greens.  Collard greens.  Eggplant.  [place].  Leeks.  Okra.  Parsley.  [place].  Spinach.  Swiss chard.  Tomato paste.  Fried potatoes.  Sweet potatoes.  Fruits:  Red currants.  Figs.  Kiwi.  Rhubarb.  Meat and Other Protein Sources:  Beans (dried).  Soy burgers and other soybean products.  Miso.  Nuts (peanuts, almonds, pecans, cashews, hazelnuts).  Nut butters.  Sesame seeds and tahini (paste made of sesame seeds).  Poppy seeds.  Beverages:  Chocolate drink mixes.  Soy milk.  Instant iced tea.  Juices made from high-oxalate fruits or vegetables.  Other:  Carob.  Chocolate.  Fruitcake.  Marmalades. Document Released: 07/23/2010 Document Revised: 04/02/2013 Document Reviewed: 02/22/2013 [facility] Patient Information 2015 [place], [place]. This information is not intended to replace advice given to you by your health care provider. Make sure  you discuss any questions you have with your health care provider.  Ureteral Colic Ureteral colic is spasm-like pain from the kidney or the ureter. This is often caused by a kidney stone. The pain is caused by the stone trying to get through the tubes that pass your pee. HOME CARE   Drink enough fluids to keep your pee (urine) clear or pale yellow.  Strain all your pee. A strainer will be  provided. Keep anything caught in the strainer and bring it to your doctor. The stone causing the pain may be very small.  Only take medicine as told by your doctor.  Follow up with your doctor as told. GET HELP RIGHT AWAY IF:   Pain is not controlled with medicine.  Pain continues or gets worse.  The pain changes and there is chest or belly (abdominal) pain.  You pass out (faint).  You cannot pee.  You keep throwing up (vomiting).  You have a temperature by mouth above 102 F (38.9 C), not controlled by medicine. MAKE SURE YOU:   Understand these instructions.  Will watch this condition.  Will get help right away if you are not doing well or get worse. Document Released: 09/14/2007 Document Revised: 06/20/2011 Document Reviewed: 09/14/2007 [facility] Patient Information 2015 [place], [place]. This information is not intended to replace advice given to you by your health care provider. Make sure you discuss any questions you have with your health care provider.

## 2014-11-23 NOTE — ED Provider Notes (Signed)
CSN: [id number]     Arrival date & time 11/23/14  0416 History   First MD Initiated Contact with Patient 11/23/14 [phone]     Chief Complaint  Patient presents with  . Flank Pain     (Consider location/radiation/quality/duration/timing/severity/associated sxs/prior Treatment) HPI patient relates yesterday evening she started having some pain in her left back. During the night the pain got a lot worse. She states the pain is in her left flank and comes around to her left upper quadrant and also into her left groin. She denies any nausea or vomiting. She denies seeing blood in her urine. She states nothing makes the pain worse and nothing makes the pain feel better. She states she's never had this pain before.  PCP Dr Sherwood Gambler  Past Medical History  Diagnosis Date  . Migraine   . Thyroid disease    Past Surgical History  Procedure Laterality Date  . Abdominal hysterectomy    . Esophagogastroduodenoscopy N/A 10/31/2014    Procedure: ESOPHAGOGASTRODUODENOSCOPY (EGD);  Surgeon: West Bali, MD;  Location: [facility];  Service: Endoscopy;  Laterality: N/A;  1200  . Savory dilation N/A 10/31/2014    Procedure: SAVORY DILATION;  Surgeon: West Bali, MD;  Location: [facility];  Service: Endoscopy;  Laterality: N/A;   No family history on file. Social History  Substance Use Topics  . Smoking status: Never Smoker   . Smokeless tobacco: None  . Alcohol Use: No   On disability for hearing deficit  OB History    No data available     Review of Systems  All other systems reviewed and are negative.     Allergies  Darvocet  Home Medications   Prior to Admission medications   Medication Sig Start Date End Date Taking? Authorizing Provider  atorvastatin (LIPITOR) 10 MG tablet Take 10 mg by mouth daily.  08/28/14  Yes Historical Provider, MD  Cetirizine HCl 10 MG CAPS Take 1 capsule by mouth daily.    Yes Historical Provider, MD  levothyroxine (SYNTHROID, LEVOTHROID) 50 MCG  tablet Take 50 mcg by mouth daily.    Yes Historical Provider, MD  lisinopril (PRINIVIL,ZESTRIL) 2.5 MG tablet Take 2.5 mg by mouth daily.  08/28/14  Yes Historical Provider, MD  pantoprazole (PROTONIX) 40 MG tablet 1 PO 30 MINS PRIOR TO YOUR FIRST MEAL FOREVER 10/31/14  Yes West Bali, MD  topiramate (TOPAMAX) 100 MG tablet Take 100 mg by mouth at bedtime.   Yes Historical Provider, MD  zolpidem (AMBIEN) 5 MG tablet Take 5 mg by mouth at bedtime.   Yes Historical Provider, MD  naproxen (NAPROSYN) 500 MG tablet Take 1 po BID with food prn pain 11/23/14   Devoria Albe, MD  ondansetron (ZOFRAN ODT) 4 MG disintegrating tablet Take 1 tablet (4 mg total) by mouth every 8 (eight) hours as needed for nausea or vomiting. 11/23/14   Devoria Albe, MD  oxyCODONE-acetaminophen (PERCOCET/ROXICET) 5-325 MG per tablet Take 1 or 2 po Q 6hrs for pain 11/23/14   Devoria Albe, MD  phentermine (ADIPEX-P) 37.5 MG tablet Take 37.5 mg by mouth every other day.     Historical Provider, MD  tamsulosin (FLOMAX) 0.4 MG CAPS capsule Take 1 po QD until you pass the stone. 11/23/14   Devoria Albe, MD   BP 133/78 mmHg  Pulse 63  Temp(Src) 97.5 F (36.4 C) (Oral)  Resp 20  Ht  (1.6 m)  Wt 165 lb (74.844 kg)  BMI 29.24 kg/m2  SpO2 95%  Vital signs normal   Physical Exam  Constitutional: She is oriented to person, place, and time. She appears well-developed and well-nourished.  Non-toxic appearance. She does not appear ill. She appears distressed.  Appears uncomfortable  HENT:  Head: Normocephalic and atraumatic.  Right Ear: External ear normal.  Left Ear: External ear normal.  Nose: Nose normal. No mucosal edema or rhinorrhea.  Mouth/Throat: Oropharynx is clear and moist and mucous membranes are normal. No dental abscesses or uvula swelling.  Eyes: Conjunctivae and EOM are normal. Pupils are equal, round, and reactive to light.  Neck: Normal range of motion and full passive range of motion without pain. Neck supple.    Cardiovascular: Normal rate, regular rhythm and normal heart sounds.  Exam reveals no gallop and no friction rub.   No murmur heard. Pulmonary/Chest: Effort normal and breath sounds normal. No respiratory distress. She has no wheezes. She has no rhonchi. She has no rales. She exhibits no tenderness and no crepitus.  Abdominal: Soft. Normal appearance and bowel sounds are normal. She exhibits no distension. There is tenderness. There is no rebound and no guarding.    Musculoskeletal: Normal range of motion. She exhibits no edema or tenderness.       Back:  Moves all extremities well.   Neurological: She is alert and oriented to person, place, and time. She has normal strength. No cranial nerve deficit.  Skin: Skin is warm, dry and intact. No rash noted. No erythema. No pallor.  Psychiatric: She has a normal mood and affect. Her speech is normal and behavior is normal. Her mood appears not anxious.  Nursing note and vitals reviewed.   ED Course  Procedures (including critical care time)  Medications  0.9 %  sodium chloride infusion ( Intravenous Stopped 11/23/14 0608)  fentaNYL (SUBLIMAZE) injection 50 mcg (50 mcg Intravenous Given 11/23/14 0527)  ondansetron (ZOFRAN) injection 4 mg (4 mg Intravenous Given 11/23/14 0527)  fentaNYL (SUBLIMAZE) injection 50 mcg (50 mcg Intravenous Given 11/23/14 0606)  HYDROmorphone (DILAUDID) injection 1 mg (1 mg Intravenous Given 11/23/14 0616)  ketorolac (TORADOL) 30 MG/ML injection 30 mg (30 mg Intravenous Given 11/23/14 0719)   Patient was given IV pain and nausea medicine. CT renal study was done to look for suspected ureterolithiasis  I reviewed her CT scan while waiting for the urologist to read it and it appears she has a left UVJ stone, and unable to assess the size. She was given IV Toradol for pain.  Patient was given the results of her CT scan and need to follow-up if she gets fever, uncontrolled vomiting or pain, otherwise she should follow up  with [facility]. She states she still has some mild pain however she feels well enough to be discharged.  Labs Review Results for orders placed or performed during the hospital encounter of 11/23/14  Urinalysis, Routine w reflex microscopic (not at [facility])  Result Value Ref Range   Color, Urine YELLOW YELLOW   APPearance CLEAR CLEAR   Specific Gravity, Urine 1.010 1.005 - 1.030   pH 6.5 5.0 - 8.0   Glucose, UA NEGATIVE NEGATIVE mg/dL   Hgb urine dipstick LARGE (A) NEGATIVE   Bilirubin Urine NEGATIVE NEGATIVE   Ketones, ur NEGATIVE NEGATIVE mg/dL   Protein, ur NEGATIVE NEGATIVE mg/dL   Urobilinogen, UA 0.2 0.0 - 1.0 mg/dL   Nitrite NEGATIVE NEGATIVE   Leukocytes, UA TRACE (A) NEGATIVE  Urine microscopic-add on  Result Value Ref Range   Squamous Epithelial /  LPF FEW (A) RARE   WBC, UA 0-2 <3 WBC/hpf   RBC / HPF 21-50 <3 RBC/hpf   Bacteria, UA FEW (A) RARE   Laboratory interpretation all normal except hematuria   Imaging Review Ct Renal Stone Study  11/23/2014   CLINICAL DATA:  Acute onset of left-sided flank pain for 2 days. Initial encounter.  EXAM: CT ABDOMEN AND PELVIS WITHOUT CONTRAST  TECHNIQUE: Multidetector CT imaging of the abdomen and pelvis was performed following the standard protocol without IV contrast.  COMPARISON:  None.  FINDINGS: Minimal bibasilar atelectasis is noted.  The liver is unremarkable in appearance. The spleen is enlarged, measuring 14.7 cm in size. The patient is status post cholecystectomy, with clips noted at the gallbladder fossa. The pancreas and adrenal glands are unremarkable.  There is mild left-sided hydronephrosis, with prominence of the left ureter along its entire course. An obstructing 4 mm stone is noted distally at the left vesicoureteral junction. Mild nonspecific perinephric stranding is noted bilaterally. A nonobstructing 3 mm stone is noted at the upper pole of the right kidney.  No free fluid is identified. The small bowel is  unremarkable in appearance. The stomach is within normal limits. No acute vascular abnormalities are seen.  The appendix is normal in caliber, without evidence of appendicitis. Scattered diverticulosis is noted along the sigmoid colon, without evidence of diverticulitis.  The bladder is mildly distended and grossly unremarkable. The patient is status post hysterectomy. No suspicious adnexal masses are seen. No inguinal lymphadenopathy is seen.  No acute osseous abnormalities are identified. 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  IMPRESSION: 1. Mild left-sided hydronephrosis, with diffuse prominence of the left ureter. Obstructing 4 mm stone noted distally at the left vesicoureteral junction. 2. Nonobstructing 3 mm stone at the upper pole of the right kidney. 3. Splenomegaly. 4. Scattered diverticulosis along the sigmoid colon, without evidence of diverticulitis. 5. Grade 1 anterolisthesis of L5 on S1, reflecting chronic bilateral pars defects at L5.   Electronically Signed   By: Roanna Raider M.D.   On: 11/23/2014 06:48   I, Lache Dagher L, personally reviewed and evaluated these images and lab results as part of my medical decision-making.   EKG Interpretation None      MDM   Final diagnoses:  Ureterolithiasis  Left ureteral stone   New Prescriptions   NAPROXEN (NAPROSYN) 500 MG TABLET    Take 1 po BID with food prn pain   ONDANSETRON (ZOFRAN ODT) 4 MG DISINTEGRATING TABLET    Take 1 tablet (4 mg total) by mouth every 8 (eight) hours as needed for nausea or vomiting.   OXYCODONE-ACETAMINOPHEN (PERCOCET/ROXICET) 5-325 MG PER TABLET    Take 1 or 2 po Q 6hrs for pain   TAMSULOSIN (FLOMAX) 0.4 MG CAPS CAPSULE    Take 1 po QD until you pass the stone.    Plan discharge  Devoria Albe, MD, Concha Pyo, MD 11/23/14 [phone]

## 2014-11-23 NOTE — ED Notes (Signed)
Pt c/o left flank pain that radiates around to left abd area, burning with urination, frequency in urination,

## 2014-11-27 DIAGNOSIS — N390 Urinary tract infection, site not specified: Secondary | ICD-10-CM | POA: Diagnosis not present

## 2014-11-27 DIAGNOSIS — E663 Overweight: Secondary | ICD-10-CM | POA: Diagnosis not present

## 2014-11-27 DIAGNOSIS — Z0001 Encounter for general adult medical examination with abnormal findings: Secondary | ICD-10-CM | POA: Diagnosis not present

## 2014-11-27 DIAGNOSIS — Z1389 Encounter for screening for other disorder: Secondary | ICD-10-CM | POA: Diagnosis not present

## 2014-11-27 DIAGNOSIS — Z6829 Body mass index (BMI) 29.0-29.9, adult: Secondary | ICD-10-CM | POA: Diagnosis not present

## 2014-12-22 DIAGNOSIS — Z1389 Encounter for screening for other disorder: Secondary | ICD-10-CM | POA: Diagnosis not present

## 2014-12-22 DIAGNOSIS — E119 Type 2 diabetes mellitus without complications: Secondary | ICD-10-CM | POA: Diagnosis not present

## 2014-12-30 ENCOUNTER — Ambulatory Visit (INDEPENDENT_AMBULATORY_CARE_PROVIDER_SITE_OTHER): Payer: Commercial Managed Care - HMO | Admitting: Nurse Practitioner

## 2014-12-30 ENCOUNTER — Encounter: Payer: Self-pay | Admitting: Nurse Practitioner

## 2014-12-30 VITALS — BP 105/70 | HR 74 | Temp 97.6°F | Ht 64.0 in | Wt 161.4 lb

## 2014-12-30 DIAGNOSIS — K2970 Gastritis, unspecified, without bleeding: Secondary | ICD-10-CM | POA: Diagnosis not present

## 2014-12-30 DIAGNOSIS — R1310 Dysphagia, unspecified: Secondary | ICD-10-CM

## 2014-12-30 NOTE — Progress Notes (Signed)
cc'ed to pcp °

## 2014-12-30 NOTE — Assessment & Plan Note (Signed)
Nonerosive gastritis on EGD. GERD symptoms much improved. Does have some occasional recurrent coughing after eating, however this is usually after nondrinking while eating and and 50 minutes after eating and drinking a cold beverage. Also has allergies. Persistent mild occasional cough could be either due to allergies or trigger by cold beverages. Return for follow-up in one year or sooner if needed. Continue PPI indefinitely.

## 2014-12-30 NOTE — Assessment & Plan Note (Signed)
Symptoms resolved after EGD with dilation. Continue PPI. Return for follow-up in one year or sooner if needed.

## 2014-12-30 NOTE — Patient Instructions (Signed)
1. Continue taking your acid blocker indefinitely. 2. Return for follow-up in one year. 3. If he have any worsening or recurrent symptoms feel free to call us and we can get she was in sooner. 4. Good luck with your kidney stone, I hope it passes soon and easily!

## 2014-12-30 NOTE — Progress Notes (Signed)
Referring Provider: Elfredia Nevins, MD Primary Care Physician:  Cassell Smiles., MD Primary GI:  Dr. Darrick Penna  Chief Complaint  Patient presents with  . Follow-up    HPI:   54 year old female presents for follow-up after upper endoscopy. Endoscopy completed 10/31/2014 on conscious sedation. Findings include dysphagia due to stricture status post dilation to 16 mm with Savary over guidewire, small hiatal hernia, mild nonerosive gastritis. Recommendations included avoid triggers for reflux, protonic strip minutes prior to a meal forever, follow-up in 3 months. Pathology of random biopsies of the stomach found mild chronic gastritis.  Today she states her swallowing is much improved. Her GERD symptoms are also improved on Prilosec. Still having coughing after eating but no aspiration. This typically happens when she eats a meal but without drinking and 15 mins after eating will have a cold beverage and it triggers a cough. Cough is improved and occasional. Denies N/V, hematochezia, melena. Denies chest pain, dyspnea, dizziness, lightheadedness, syncope, near syncope. Denies any other upper or lower GI symptoms.  Past Medical History  Diagnosis Date  . Migraine   . Thyroid disease     Past Surgical History  Procedure Laterality Date  . Abdominal hysterectomy    . Esophagogastroduodenoscopy N/A 10/31/2014    WUJ:[id number] HH/mild non-erosive gastritis/dysphagia due to stricture  . Savory dilation N/A 10/31/2014    Procedure: SAVORY DILATION;  Surgeon: West Bali, MD;  Location: [facility];  Service: Endoscopy;  Laterality: N/A;    Current Outpatient Prescriptions  Medication Sig Dispense Refill  . atorvastatin (LIPITOR) 10 MG tablet Take 10 mg by mouth daily.     . Cetirizine HCl 10 MG CAPS Take 1 capsule by mouth daily.     Marland Kitchen levothyroxine (SYNTHROID, LEVOTHROID) 50 MCG tablet Take 50 mcg by mouth daily.     Marland Kitchen lisinopril (PRINIVIL,ZESTRIL) 2.5 MG tablet Take 2.5 mg by mouth  daily.     . naproxen (NAPROSYN) 500 MG tablet Take 1 po BID with food prn pain 30 tablet 0  . oxyCODONE-acetaminophen (PERCOCET/ROXICET) 5-325 MG per tablet Take 1 or 2 po Q 6hrs for pain 25 tablet 0  . pantoprazole (PROTONIX) 40 MG tablet 1 PO 30 MINS PRIOR TO YOUR FIRST MEAL FOREVER 30 tablet 11  . phentermine (ADIPEX-P) 37.5 MG tablet Take 37.5 mg by mouth every other day.     . tamsulosin (FLOMAX) 0.4 MG CAPS capsule Take 1 po QD until you pass the stone. 10 capsule 0  . topiramate (TOPAMAX) 100 MG tablet Take 100 mg by mouth at bedtime.    Marland Kitchen zolpidem (AMBIEN) 5 MG tablet Take 5 mg by mouth at bedtime.    . ondansetron (ZOFRAN ODT) 4 MG disintegrating tablet Take 1 tablet (4 mg total) by mouth every 8 (eight) hours as needed for nausea or vomiting. (Patient not taking: Reported on 12/30/2014) 20 tablet 0   No current facility-administered medications for this visit.    Allergies as of 12/30/2014 - Review Complete 12/30/2014  Allergen Reaction Noted  . Darvocet [propoxyphene n-acetaminophen]  08/04/2011    No family history on file.  Social History   Social History  . Marital Status: Single    Spouse Name: N/A  . Number of Children: N/A  . Years of Education: N/A   Social History Main Topics  . Smoking status: Never Smoker   . Smokeless tobacco: None  . Alcohol Use: No  . Drug Use: No  . Sexual Activity: Yes    Birth  Control/ Protection: None   Other Topics Concern  . None   Social History Narrative    Review of Systems: 10-point ROS negative except as per HPI.   Physical Exam: BP 105/70 mmHg  Pulse 74  Temp(Src) 97.6 F (36.4 C) (Oral)  Ht  (1.626 m)  Wt 161 lb 6.4 oz (73.211 kg)  BMI 27.69 kg/m2 General:   Alert and oriented. Pleasant and cooperative. Well-nourished and well-developed.  Head:  Normocephalic and atraumatic. Cardiovascular:  S1, S2 present without murmurs appreciated. Normal pulses noted. Extremities without clubbing or  edema. Respiratory:  Clear to auscultation bilaterally. No wheezes, rales, or rhonchi. No distress.  Gastrointestinal:  +BS, soft, non-tender and non-distended. No HSM noted. No guarding or rebound. No masses appreciated.  Rectal:  Deferred  Psych:  Alert and cooperative. Normal mood and affect.    12/30/2014 1:55 PM

## 2015-02-13 DIAGNOSIS — Z91018 Allergy to other foods: Secondary | ICD-10-CM | POA: Diagnosis not present

## 2015-02-13 DIAGNOSIS — J3089 Other allergic rhinitis: Secondary | ICD-10-CM | POA: Diagnosis not present

## 2015-02-13 DIAGNOSIS — J301 Allergic rhinitis due to pollen: Secondary | ICD-10-CM | POA: Diagnosis not present

## 2015-02-13 DIAGNOSIS — L501 Idiopathic urticaria: Secondary | ICD-10-CM | POA: Diagnosis not present

## 2015-06-23 ENCOUNTER — Emergency Department (HOSPITAL_COMMUNITY)
Admission: EM | Admit: 2015-06-23 | Discharge: 2015-06-23 | Disposition: A | Discharge status: Home / Self Care | Payer: Commercial Managed Care - HMO | Attending: Emergency Medicine | Admitting: Emergency Medicine

## 2015-06-23 ENCOUNTER — Encounter (HOSPITAL_COMMUNITY): Payer: Self-pay | Admitting: Emergency Medicine

## 2015-06-23 ENCOUNTER — Emergency Department (HOSPITAL_COMMUNITY): Payer: Commercial Managed Care - HMO

## 2015-06-23 DIAGNOSIS — Z791 Long term (current) use of non-steroidal anti-inflammatories (NSAID): Secondary | ICD-10-CM | POA: Diagnosis not present

## 2015-06-23 DIAGNOSIS — E785 Hyperlipidemia, unspecified: Secondary | ICD-10-CM | POA: Diagnosis not present

## 2015-06-23 DIAGNOSIS — Z8669 Personal history of other diseases of the nervous system and sense organs: Secondary | ICD-10-CM | POA: Diagnosis not present

## 2015-06-23 DIAGNOSIS — E039 Hypothyroidism, unspecified: Secondary | ICD-10-CM | POA: Insufficient documentation

## 2015-06-23 DIAGNOSIS — Z79899 Other long term (current) drug therapy: Secondary | ICD-10-CM | POA: Insufficient documentation

## 2015-06-23 DIAGNOSIS — J069 Acute upper respiratory infection, unspecified: Secondary | ICD-10-CM | POA: Insufficient documentation

## 2015-06-23 DIAGNOSIS — Z79891 Long term (current) use of opiate analgesic: Secondary | ICD-10-CM | POA: Insufficient documentation

## 2015-06-23 DIAGNOSIS — R05 Cough: Secondary | ICD-10-CM | POA: Diagnosis not present

## 2015-06-23 MED ORDER — GUAIFENESIN-CODEINE 100-10 MG/5ML PO SOLN: 5.0000 mL | Freq: Four times a day (QID) | ORAL | Status: DC | PRN

## 2015-06-23 MED ORDER — GUAIFENESIN-CODEINE 100-10 MG/5ML PO SOLN
10.0000 mL | Freq: Once | ORAL | Status: AC
  Administered 2015-06-23: 10 mL via ORAL
  Filled 2015-06-23: qty 10

## 2015-06-23 MED ORDER — FLUTICASONE PROPIONATE 50 MCG/ACT NA SUSP: 2.0000 | Freq: Every day | NASAL | Status: DC

## 2015-06-23 MED ORDER — ONDANSETRON 4 MG PO TBDP
4.0000 mg | ORAL_TABLET | Freq: Once | ORAL | Status: AC
  Administered 2015-06-23: 4 mg via ORAL
  Filled 2015-06-23: qty 1

## 2015-06-23 NOTE — ED Notes (Signed)
Pt has been coughing for over one week.

## 2015-06-23 NOTE — ED Provider Notes (Signed)
TIME SEEN: 1:40 AM  CHIEF COMPLAINT: Cough  HPI: Pt is a 55 y.o. female with history of migraines, hypothyroidism, hyperlipidemia who presents to the emergency department with complaints of dry cough for the past week that has progressed into a cough with yellow sputum production. No fevers or chills. States her cough is worse at night and NyQuil over-the-counter which normally helps with her cough is not helping. Denies any chest pain or shortness of breath. No vomiting or diarrhea. She is not a smoker and does not wear oxygen at home. She has been using her husband's albuterol treatments with some relief.  ROS: See HPI Constitutional: no fever  Eyes: no drainage  ENT: no runny nose   Cardiovascular:  no chest pain  Resp: no SOB  GI: no vomiting GU: no dysuria Integumentary: no rash  Allergy: no hives  Musculoskeletal: no leg swelling  Neurological: no slurred speech ROS otherwise negative  PAST MEDICAL HISTORY/PAST SURGICAL HISTORY:  Past Medical History  Diagnosis Date  . Migraine   . Thyroid disease     MEDICATIONS:  Prior to Admission medications   Medication Sig Start Date End Date Taking? Authorizing Provider  atorvastatin (LIPITOR) 10 MG tablet Take 10 mg by mouth daily.  08/28/14  Yes Historical Provider, MD  Cetirizine HCl 10 MG CAPS Take 1 capsule by mouth daily.    Yes Historical Provider, MD  levothyroxine (SYNTHROID, LEVOTHROID) 50 MCG tablet Take 50 mcg by mouth daily.    Yes Historical Provider, MD  lisinopril (PRINIVIL,ZESTRIL) 2.5 MG tablet Take 2.5 mg by mouth daily.  08/28/14  Yes Historical Provider, MD  pantoprazole (PROTONIX) 40 MG tablet 1 PO 30 MINS PRIOR TO YOUR FIRST MEAL FOREVER 10/31/14  Yes West Bali, MD  phentermine (ADIPEX-P) 37.5 MG tablet Take 37.5 mg by mouth every other day.    Yes Historical Provider, MD  topiramate (TOPAMAX) 100 MG tablet Take 100 mg by mouth at bedtime.   Yes Historical Provider, MD  zolpidem (AMBIEN) 5 MG tablet Take 5 mg  by mouth at bedtime.   Yes Historical Provider, MD  naproxen (NAPROSYN) 500 MG tablet Take 1 po BID with food prn pain 11/23/14   Devoria Albe, MD  ondansetron (ZOFRAN ODT) 4 MG disintegrating tablet Take 1 tablet (4 mg total) by mouth every 8 (eight) hours as needed for nausea or vomiting. Patient not taking: Reported on 12/30/2014 11/23/14   Devoria Albe, MD  oxyCODONE-acetaminophen (PERCOCET/ROXICET) 5-325 MG per tablet Take 1 or 2 po Q 6hrs for pain 11/23/14   Devoria Albe, MD  tamsulosin (FLOMAX) 0.4 MG CAPS capsule Take 1 po QD until you pass the stone. 11/23/14   Devoria Albe, MD    ALLERGIES:  Allergies  Allergen Reactions  . Darvocet [Propoxyphene N-Acetaminophen]     SOCIAL HISTORY:  Social History  Substance Use Topics  . Smoking status: Never Smoker   . Smokeless tobacco: Not on file  . Alcohol Use: No    FAMILY HISTORY: History reviewed. No pertinent family history.  EXAM: BP 104/86 mmHg  Pulse 97  Temp(Src) 97.9 F (36.6 C) (Oral)  Resp 16  Ht  (1.6 m)  Wt 157 lb (71.215 kg)  BMI 27.82 kg/m2  SpO2 99% CONSTITUTIONAL: Alert and oriented and responds appropriately to questions. Well-appearing; well-nourished HEAD: Normocephalic EYES: Conjunctivae clear, PERRL ENT: normal nose; no rhinorrhea; moist mucous membranes; No pharyngeal erythema or petechiae, no tonsillar hypertrophy or exudate, no uvular deviation, no trismus or drooling, normal  phonation, no stridor, no dental caries or abscess noted, no Ludwig's angina, tongue sits flat in the bottom of the mouth NECK: Supple, no meningismus, no LAD  CARD: RRR; S1 and S2 appreciated; no murmurs, no clicks, no rubs, no gallops RESP: Normal chest excursion without splinting or tachypnea; breath sounds clear and equal bilaterally; no wheezes, no rhonchi, no rales, no hypoxia or respiratory distress, speaking full sentences ABD/GI: Normal bowel sounds; non-distended; soft, non-tender, no rebound, no guarding, no peritoneal  signs BACK:  The back appears normal and is non-tender to palpation, there is no CVA tenderness EXT: Normal ROM in all joints; non-tender to palpation; no edema; normal capillary refill; no cyanosis, no calf tenderness or swelling    SKIN: Normal color for age and race; warm; no rash NEURO: Moves all extremities equally, sensation to light touch intact diffusely, cranial nerves II through XII intact PSYCH: The patient's mood and manner are appropriate. Grooming and personal hygiene are appropriate.  MEDICAL DECISION MAKING: Patient here with cough for one week. Her lungs are completely clear to auscultation. Discussed with patient that this may be viral illness, postnasal drip. We'll treat her symptoms with adenosine with codeine in the emergency department. Her husband is here and can drive her home. We'll obtain a chest x-ray to rule out pneumonia although my suspicion for this is low.  ED PROGRESS: Patient's chest x-ray is clear. I suspect her symptoms of cough are secondary to viral illness, postnasal drip. She is already on medication for heartburn. She is also already on antihistamine. We'll start her on nasal steroids and discharged with medicine with codeine to take as needed. Have advised her not to take this medication with her Ambien. I do not feel she needs to be on antibiotics at this time. Discussed return precautions. I feel she is safe to be discharged. Patient and husband verbalize understanding and are comfortable with this plan.     Layla MawKristen N Ward, DO 06/23/15 [phone]

## 2015-06-23 NOTE — ED Notes (Signed)
Patient transported to X-ray 

## 2015-06-23 NOTE — Discharge Instructions (Signed)
Upper Respiratory Infection, Adult Most upper respiratory infections (URIs) are a viral infection of the air passages leading to the lungs. A URI affects the nose, throat, and upper air passages. The most common type of URI is nasopharyngitis and is typically referred to as "the common cold." URIs run their course and usually go away on their own. Most of the time, a URI does not require medical attention, but sometimes a bacterial infection in the upper airways can follow a viral infection. This is called a secondary infection. Sinus and middle ear infections are common types of secondary upper respiratory infections. Bacterial pneumonia can also complicate a URI. A URI can worsen asthma and chronic obstructive pulmonary disease (COPD). Sometimes, these complications can require emergency medical care and may be life threatening.  CAUSES Almost all URIs are caused by viruses. A virus is a type of germ and can spread from one person to another.  RISKS FACTORS You may be at risk for a URI if:   You smoke.   You have chronic heart or lung disease.  You have a weakened defense (immune) system.   You are very young or very old.   You have nasal allergies or asthma.  You work in crowded or poorly ventilated areas.  You work in health care facilities or schools. SIGNS AND SYMPTOMS  Symptoms typically develop 2-3 days after you come in contact with a cold virus. Most viral URIs last 7-10 days. However, viral URIs from the influenza virus (flu virus) can last 14-18 days and are typically more severe. Symptoms may include:   Runny or stuffy (congested) nose.   Sneezing.   Cough.   Sore throat.   Headache.   Fatigue.   Fever.   Loss of appetite.   Pain in your forehead, behind your eyes, and over your cheekbones (sinus pain).  Muscle aches.  DIAGNOSIS  Your health care provider may diagnose a URI by:  Physical exam.  Tests to check that your symptoms are not due to  another condition such as:  Strep throat.  Sinusitis.  Pneumonia.  Asthma. TREATMENT  A URI goes away on its own with time. It cannot be cured with medicines, but medicines may be prescribed or recommended to relieve symptoms. Medicines may help:  Reduce your fever.  Reduce your cough.  Relieve nasal congestion. HOME CARE INSTRUCTIONS   Take medicines only as directed by your health care provider.   Gargle warm saltwater or take cough drops to comfort your throat as directed by your health care provider.  Use a warm mist humidifier or inhale steam from a shower to increase air moisture. This may make it easier to breathe.  Drink enough fluid to keep your urine clear or pale yellow.   Eat soups and other clear broths and maintain good nutrition.   Rest as needed.   Return to work when your temperature has returned to normal or as your health care provider advises. You may need to stay home longer to avoid infecting others. You can also use a face mask and careful hand washing to prevent spread of the virus.  Increase the usage of your inhaler if you have asthma.   Do not use any tobacco products, including cigarettes, chewing tobacco, or electronic cigarettes. If you need help quitting, ask your health care provider. PREVENTION  The best way to protect yourself from getting a cold is to practice good hygiene.   Avoid oral or hand contact with people with cold   symptoms.   Wash your hands often if contact occurs.  There is no clear evidence that vitamin C, vitamin E, echinacea, or exercise reduces the chance of developing a cold. However, it is always recommended to get plenty of rest, exercise, and practice good nutrition.  SEEK MEDICAL CARE IF:   You are getting worse rather than better.   Your symptoms are not controlled by medicine.   You have chills.  You have worsening shortness of breath.  You have brown or red mucus.  You have yellow or brown nasal  discharge.  You have pain in your face, especially when you bend forward.  You have a fever.  You have swollen neck glands.  You have pain while swallowing.  You have white areas in the back of your throat. SEEK IMMEDIATE MEDICAL CARE IF:   You have severe or persistent:  Headache.  Ear pain.  Sinus pain.  Chest pain.  You have chronic lung disease and any of the following:  Wheezing.  Prolonged cough.  Coughing up blood.  A change in your usual mucus.  You have a stiff neck.  You have changes in your:  Vision.  Hearing.  Thinking.  Mood. MAKE SURE YOU:   Understand these instructions.  Will watch your condition.  Will get help right away if you are not doing well or get worse.   This information is not intended to replace advice given to you by your health care provider. Make sure you discuss any questions you have with your health care provider.   Document Released: 09/21/2000 Document Revised: 08/12/2014 Document Reviewed: 07/03/2013 Elsevier Interactive Patient Education 2016 Elsevier Inc.  

## 2015-07-17 ENCOUNTER — Emergency Department (HOSPITAL_COMMUNITY)
Admission: EM | Admit: 2015-07-17 | Discharge: 2015-07-18 | Disposition: A | Discharge status: Home / Self Care | Payer: Commercial Managed Care - HMO | Attending: Emergency Medicine | Admitting: Emergency Medicine

## 2015-07-17 ENCOUNTER — Encounter (HOSPITAL_COMMUNITY): Payer: Self-pay | Admitting: *Deleted

## 2015-07-17 DIAGNOSIS — Z8669 Personal history of other diseases of the nervous system and sense organs: Secondary | ICD-10-CM | POA: Insufficient documentation

## 2015-07-17 DIAGNOSIS — N390 Urinary tract infection, site not specified: Secondary | ICD-10-CM

## 2015-07-17 DIAGNOSIS — R51 Headache: Secondary | ICD-10-CM | POA: Diagnosis not present

## 2015-07-17 DIAGNOSIS — R739 Hyperglycemia, unspecified: Secondary | ICD-10-CM | POA: Diagnosis not present

## 2015-07-17 DIAGNOSIS — D72829 Elevated white blood cell count, unspecified: Secondary | ICD-10-CM | POA: Insufficient documentation

## 2015-07-17 DIAGNOSIS — Z79899 Other long term (current) drug therapy: Secondary | ICD-10-CM | POA: Diagnosis not present

## 2015-07-17 LAB — URINALYSIS, ROUTINE W REFLEX MICROSCOPIC
BILIRUBIN URINE: NEGATIVE
GLUCOSE, UA: NEGATIVE mg/dL
Hgb urine dipstick: NEGATIVE
KETONES UR: NEGATIVE mg/dL
NITRITE: NEGATIVE
PH: 6 (ref 5.0–8.0)
Protein, ur: NEGATIVE mg/dL
SPECIFIC GRAVITY, URINE: 1.01 (ref 1.005–1.030)

## 2015-07-17 LAB — BASIC METABOLIC PANEL
ANION GAP: 9 (ref 5–15)
BUN: 12 mg/dL (ref 6–20)
CHLORIDE: 107 mmol/L (ref 101–111)
CO2: 24 mmol/L (ref 22–32)
Calcium: 8.8 mg/dL — ABNORMAL LOW (ref 8.9–10.3)
Creatinine, Ser: 1.17 mg/dL — ABNORMAL HIGH (ref 0.44–1.00)
GFR calc non Af Amer: 53 mL/min — ABNORMAL LOW (ref >60)
GLUCOSE: 243 mg/dL — AB (ref 65–99)
Potassium: 3.6 mmol/L (ref 3.5–5.1)
Sodium: 140 mmol/L (ref 135–145)

## 2015-07-17 LAB — URINE MICROSCOPIC-ADD ON

## 2015-07-17 LAB — CBG MONITORING, ED: Glucose-Capillary: 209 mg/dL — ABNORMAL HIGH (ref 65–99)

## 2015-07-17 MED ORDER — CEPHALEXIN 500 MG PO CAPS
500.0000 mg | ORAL_CAPSULE | Freq: Once | ORAL | Status: AC
Start: 2015-07-17 — End: 2015-07-18
  Administered 2015-07-18: 500 mg via ORAL
  Filled 2015-07-17: qty 1

## 2015-07-17 NOTE — ED Provider Notes (Signed)
CSN: [id number]     Arrival date & time 07/17/15  2232 History   First MD Initiated Contact with Patient 07/17/15 2251     Chief Complaint  Patient presents with  . Hyperglycemia     (Consider location/radiation/quality/duration/timing/severity/associated sxs/prior Treatment) HPI Comments: Patient is a 55 year old female who presents to the emergency department with a complaint of hyperglycemia.  The patient has a history of migraine headaches and thyroid disease. She has been told that she is "prediabetic".  The patient states that she has been having problems with headache. She had her blood sugar checked on yesterday April 6 and was found to be elevated at 265. The patient states that she's been told that she was prediabetic. She has a history of diabetics in her family on. The patient says that she has usually exercising by walking during the summer, but has not been doing any walking during the winter months. Her primary physician placed her on a diet pill, and she states that sometimes she takes it, and sometimes she does not. She is concerned because of her sugar being high, and her having a headache. There's been no vomiting or diarrhea reported. She denies dysuria or excessive thirst. No changes in her appetite. No other signs of infection reported. No new changes in diet.  Patient is a 55 y.o. female presenting with hyperglycemia. The history is provided by the patient.  Hyperglycemia Blood sugar level PTA:  265   Past Medical History  Diagnosis Date  . Migraine   . Thyroid disease    Past Surgical History  Procedure Laterality Date  . Abdominal hysterectomy    . Esophagogastroduodenoscopy N/A 10/31/2014    WUJ:[id number] HH/mild non-erosive gastritis/dysphagia due to stricture  . Savory dilation N/A 10/31/2014    Procedure: SAVORY DILATION;  Surgeon: West Bali, MD;  Location: [facility];  Service: Endoscopy;  Laterality: N/A;   History reviewed. No pertinent family  history. Social History  Substance Use Topics  . Smoking status: Never Smoker   . Smokeless tobacco: None  . Alcohol Use: No   OB History    No data available     Review of Systems  Neurological: Positive for headaches.      Allergies  Darvocet  Home Medications   Prior to Admission medications   Medication Sig Start Date End Date Taking? Authorizing Provider  atorvastatin (LIPITOR) 10 MG tablet Take 10 mg by mouth daily.  08/28/14   Historical Provider, MD  Cetirizine HCl 10 MG CAPS Take 1 capsule by mouth daily.     Historical Provider, MD  fluticasone (FLONASE) 50 MCG/ACT nasal spray Place 2 sprays into both nostrils daily. 06/23/15   Kristen N Ward, DO  guaiFENesin-codeine 100-10 MG/5ML syrup Take 5-10 mLs by mouth every 6 (six) hours as needed for cough. 06/23/15   Kristen N Ward, DO  levothyroxine (SYNTHROID, LEVOTHROID) 50 MCG tablet Take 50 mcg by mouth daily.     Historical Provider, MD  lisinopril (PRINIVIL,ZESTRIL) 2.5 MG tablet Take 2.5 mg by mouth daily.  08/28/14   Historical Provider, MD  pantoprazole (PROTONIX) 40 MG tablet 1 PO 30 MINS PRIOR TO YOUR FIRST MEAL FOREVER 10/31/14   West Bali, MD  phentermine (ADIPEX-P) 37.5 MG tablet Take 37.5 mg by mouth every other day.     Historical Provider, MD  tamsulosin (FLOMAX) 0.4 MG CAPS capsule Take 1 po QD until you pass the stone. 11/23/14   Devoria Albe, MD  topiramate (TOPAMAX) 100 MG  tablet Take 100 mg by mouth at bedtime.    Historical Provider, MD  zolpidem (AMBIEN) 5 MG tablet Take 5 mg by mouth at bedtime.    Historical Provider, MD   There were no vitals taken for this visit. Physical Exam  Constitutional: She is oriented to person, place, and time. She appears well-developed and well-nourished.  Non-toxic appearance.  HENT:  Head: Normocephalic.  Right Ear: Tympanic membrane and external ear normal.  Left Ear: Tympanic membrane and external ear normal.  Eyes: EOM and lids are normal. Pupils are equal, round,  and reactive to light.  Neck: Normal range of motion. Neck supple. Carotid bruit is not present.  Cardiovascular: Normal rate, regular rhythm, normal heart sounds, intact distal pulses and normal pulses.   Pulmonary/Chest: Breath sounds normal. No respiratory distress.  Abdominal: Soft. Bowel sounds are normal. There is no tenderness. There is no guarding.  Musculoskeletal: Normal range of motion.  Lymphadenopathy:       Head (right side): No submandibular adenopathy present.       Head (left side): No submandibular adenopathy present.    She has no cervical adenopathy.  Neurological: She is alert and oriented to person, place, and time. She has normal strength. No cranial nerve deficit or sensory deficit.  Skin: Skin is warm and dry.  Psychiatric: She has a normal mood and affect. Her speech is normal.  Nursing note and vitals reviewed.   ED Course  Procedures (including critical care time) Labs Review Labs Reviewed  CBG MONITORING, ED - Abnormal; Notable for the following:    Glucose-Capillary 209 (*)    All other components within normal limits  URINALYSIS, ROUTINE W REFLEX MICROSCOPIC ([facility])  BASIC METABOLIC PANEL    Imaging Review No results found. I have personally reviewed and evaluated these images and lab results as part of my medical decision-making.   EKG Interpretation None      MDM  Urinalysis shows a moderate leukocytosis with 6-30 white blood cells per high-powered field. The basic metabolic panel shows the glucose to be elevated at 243, the creatinine elevated at 1.17 on. The anion gap is normal at 9.  I discussed the findings with with patient. The patient will be treated with Keflex for the urinary tract infection. I've asked her to increase her water, and to refrain from of starches and sweets over the weekend until she can see her physician for recheck and evaluation concerning her glucose. The patient acknowledges understanding of the discharge  instructions and is in agreement.    Final diagnoses:  None    **I have reviewed nursing notes, vital signs, and all appropriate lab and imaging results for this patient.Ivery Quale*    Tynleigh Birt, PA-C 07/18/15 [phone]  Bethann BerkshireJoseph Zammit, MD 07/19/15 [phone]

## 2015-07-17 NOTE — ED Notes (Signed)
Pt states she has been having headaches

## 2015-07-17 NOTE — ED Notes (Signed)
Pt reports she has been told that she is "pre-diabetic." Pt states her sugar has been as high as 265 since yesterday. Pt also reports a headache.

## 2015-07-17 NOTE — ED Notes (Signed)
Patient ambulatory to restroom with steady gait, clean catch instructions given and advised pt to bring specimen back to room as well.  

## 2015-07-18 DIAGNOSIS — R739 Hyperglycemia, unspecified: Secondary | ICD-10-CM | POA: Diagnosis not present

## 2015-07-18 MED ORDER — CEPHALEXIN 500 MG PO CAPS: 500.0000 mg | ORAL_CAPSULE | Freq: Four times a day (QID) | ORAL | Status: DC

## 2015-07-18 NOTE — ED Notes (Signed)
Patient given discharge instruction, verbalized understand. Patient ambulatory out of the department.  

## 2015-07-18 NOTE — Discharge Instructions (Signed)
Please stay away from breads, starches and sweets over the weekend. Please increase water to 6-8 glasses per day. It is important that you see Dr.Fusco, or a member of his team as soon as possible next week for follow-up of your elevated blood glucose. Your urine tests suggest a urinary tract infection. Please use Keflex 4 times daily for this issue. Please return to the emergency department if any emergent changes, problems, or concerns before your seen by your primary physician. Hyperglycemia Hyperglycemia occurs when the glucose (sugar) in your blood is too high. Hyperglycemia can happen for many reasons, but it most often happens to people who do not know they have diabetes or are not managing their diabetes properly.  CAUSES  Whether you have diabetes or not, there are other causes of hyperglycemia. Hyperglycemia can occur when you have diabetes, but it can also occur in other situations that you might not be as aware of, such as: Diabetes  If you have diabetes and are having problems controlling your blood glucose, hyperglycemia could occur because of some of the following reasons:  Not following your meal plan.  Not taking your diabetes medications or not taking it properly.  Exercising less or doing less activity than you normally do.  Being sick. Pre-diabetes  This cannot be ignored. Before people develop Type 2 diabetes, they almost always have "pre-diabetes." This is when your blood glucose levels are higher than normal, but not yet high enough to be diagnosed as diabetes. Research has shown that some long-term damage to the body, especially the heart and circulatory system, may already be occurring during pre-diabetes. If you take action to manage your blood glucose when you have pre-diabetes, you may delay or prevent Type 2 diabetes from developing. Stress  If you have diabetes, you may be "diet" controlled or on oral medications or insulin to control your diabetes. However, you may  find that your blood glucose is higher than usual in the hospital whether you have diabetes or not. This is often referred to as "stress hyperglycemia." Stress can elevate your blood glucose. This happens because of hormones put out by the body during times of stress. If stress has been the cause of your high blood glucose, it can be followed regularly by your caregiver. That way he/she can make sure your hyperglycemia does not continue to get worse or progress to diabetes. Steroids  Steroids are medications that act on the infection fighting system (immune system) to block inflammation or infection. One side effect can be a rise in blood glucose. Most people can produce enough extra insulin to allow for this rise, but for those who cannot, steroids make blood glucose levels go even higher. It is not unusual for steroid treatments to "uncover" diabetes that is developing. It is not always possible to determine if the hyperglycemia will go away after the steroids are stopped. A special blood test called an A1c is sometimes done to determine if your blood glucose was elevated before the steroids were started. SYMPTOMS  Thirsty.  Frequent urination.  Dry mouth.  Blurred vision.  Tired or fatigue.  Weakness.  Sleepy.  Tingling in feet or leg. DIAGNOSIS  Diagnosis is made by monitoring blood glucose in one or all of the following ways:  A1c test. This is a chemical found in your blood.  Fingerstick blood glucose monitoring.  Laboratory results. TREATMENT  First, knowing the cause of the hyperglycemia is important before the hyperglycemia can be treated. Treatment may include, but is  not be limited to:  Education.  Change or adjustment in medications.  Change or adjustment in meal plan.  Treatment for an illness, infection, etc.  More frequent blood glucose monitoring.  Change in exercise plan.  Decreasing or stopping steroids.  Lifestyle changes. HOME CARE INSTRUCTIONS    Test your blood glucose as directed.  Exercise regularly. Your caregiver will give you instructions about exercise. Pre-diabetes or diabetes which comes on with stress is helped by exercising.  Eat wholesome, balanced meals. Eat often and at regular, fixed times. Your caregiver or nutritionist will give you a meal plan to guide your sugar intake.  Being at an ideal weight is important. If needed, losing as little as 10 to 15 pounds may help improve blood glucose levels. SEEK MEDICAL CARE IF:   You have questions about medicine, activity, or diet.  You continue to have symptoms (problems such as increased thirst, urination, or weight gain). SEEK IMMEDIATE MEDICAL CARE IF:   You are vomiting or have diarrhea.  Your breath smells fruity.  You are breathing faster or slower.  You are very sleepy or incoherent.  You have numbness, tingling, or pain in your feet or hands.  You have chest pain.  Your symptoms get worse even though you have been following your caregiver's orders.  If you have any other questions or concerns.   This information is not intended to replace advice given to you by your health care provider. Make sure you discuss any questions you have with your health care provider.   Document Released: 09/21/2000 Document Revised: 06/20/2011 Document Reviewed: 12/02/2014 Elsevier Interactive Patient Education 2016 Elsevier Inc.  Urinary Tract Infection A urinary tract infection (UTI) can occur any place along the urinary tract. The tract includes the kidneys, ureters, bladder, and urethra. A type of germ called bacteria often causes a UTI. UTIs are often helped with antibiotic medicine.  HOME CARE   If given, take antibiotics as told by your doctor. Finish them even if you start to feel better.  Drink enough fluids to keep your pee (urine) clear or pale yellow.  Avoid tea, drinks with caffeine, and bubbly (carbonated) drinks.  Pee often. Avoid holding your pee  in for a long time.  Pee before and after having sex (intercourse).  Wipe from front to back after you poop (bowel movement) if you are a woman. Use each tissue only once. GET HELP RIGHT AWAY IF:   You have back pain.  You have lower belly (abdominal) pain.  You have chills.  You feel sick to your stomach (nauseous).  You throw up (vomit).  Your burning or discomfort with peeing does not go away.  You have a fever.  Your symptoms are not better in 3 days. MAKE SURE YOU:   Understand these instructions.  Will watch your condition.  Will get help right away if you are not doing well or get worse.   This information is not intended to replace advice given to you by your health care provider. Make sure you discuss any questions you have with your health care provider.   Document Released: 09/14/2007 Document Revised: 04/18/2014 Document Reviewed: 10/27/2011 Elsevier Interactive Patient Education [employer]

## 2015-07-20 LAB — URINE CULTURE: Special Requests: NORMAL

## 2015-07-22 DIAGNOSIS — K219 Gastro-esophageal reflux disease without esophagitis: Secondary | ICD-10-CM | POA: Diagnosis not present

## 2015-07-22 DIAGNOSIS — E6609 Other obesity due to excess calories: Secondary | ICD-10-CM | POA: Diagnosis not present

## 2015-07-22 DIAGNOSIS — E119 Type 2 diabetes mellitus without complications: Secondary | ICD-10-CM | POA: Diagnosis not present

## 2015-07-22 DIAGNOSIS — E063 Autoimmune thyroiditis: Secondary | ICD-10-CM | POA: Diagnosis not present

## 2015-07-22 DIAGNOSIS — Z6831 Body mass index (BMI) 31.0-31.9, adult: Secondary | ICD-10-CM | POA: Diagnosis not present

## 2015-07-22 DIAGNOSIS — N390 Urinary tract infection, site not specified: Secondary | ICD-10-CM | POA: Diagnosis not present

## 2015-07-22 DIAGNOSIS — Z1389 Encounter for screening for other disorder: Secondary | ICD-10-CM | POA: Diagnosis not present

## 2015-07-23 DIAGNOSIS — E119 Type 2 diabetes mellitus without complications: Secondary | ICD-10-CM | POA: Diagnosis not present

## 2015-10-10 ENCOUNTER — Other Ambulatory Visit: Payer: Self-pay | Admitting: Gastroenterology

## 2015-10-26 DIAGNOSIS — K219 Gastro-esophageal reflux disease without esophagitis: Secondary | ICD-10-CM | POA: Diagnosis not present

## 2015-10-26 DIAGNOSIS — Z6830 Body mass index (BMI) 30.0-30.9, adult: Secondary | ICD-10-CM | POA: Diagnosis not present

## 2015-10-26 DIAGNOSIS — E1165 Type 2 diabetes mellitus with hyperglycemia: Secondary | ICD-10-CM | POA: Diagnosis not present

## 2015-10-26 DIAGNOSIS — E063 Autoimmune thyroiditis: Secondary | ICD-10-CM | POA: Diagnosis not present

## 2015-11-11 ENCOUNTER — Encounter: Payer: Self-pay | Admitting: Gastroenterology

## 2015-11-15 ENCOUNTER — Other Ambulatory Visit: Payer: Self-pay | Admitting: Gastroenterology

## 2015-12-02 DIAGNOSIS — Z0000 Encounter for general adult medical examination without abnormal findings: Secondary | ICD-10-CM | POA: Diagnosis not present

## 2015-12-02 DIAGNOSIS — Z6830 Body mass index (BMI) 30.0-30.9, adult: Secondary | ICD-10-CM | POA: Diagnosis not present

## 2015-12-17 ENCOUNTER — Ambulatory Visit (INDEPENDENT_AMBULATORY_CARE_PROVIDER_SITE_OTHER): Payer: Commercial Managed Care - HMO | Admitting: Gastroenterology

## 2015-12-17 ENCOUNTER — Encounter: Payer: Self-pay | Admitting: Gastroenterology

## 2015-12-17 DIAGNOSIS — K219 Gastro-esophageal reflux disease without esophagitis: Secondary | ICD-10-CM | POA: Diagnosis not present

## 2015-12-17 DIAGNOSIS — R1310 Dysphagia, unspecified: Secondary | ICD-10-CM

## 2015-12-17 DIAGNOSIS — Z1211 Encounter for screening for malignant neoplasm of colon: Secondary | ICD-10-CM | POA: Diagnosis not present

## 2015-12-17 NOTE — Progress Notes (Signed)
ON RECALL  °

## 2015-12-17 NOTE — Progress Notes (Signed)
   Subjective:    Patient ID: Gina Osborne, female    DOB: [birth year], 55 y.o.   MRN: [id number]  Cassell SmilesFUSCO,LAWRENCE J., MD  HPI JUST started noticing COUGHING DURING DRINKING COLD DRINKS THROUGH A STRAW. DOESN'T HAPPEN WITH SOLID FOODS. NO DRINK COMING BACK THROUGH NOSE. BMS: OK.DIARRHEA ONCE A WEEK DEPENDING ON WHAT SHE EATS. DRINKS A LOT OF MTN DEW. WEIGH UP FROM 161 LBS SEP 2016 TO 173 LBS.  PT DENIES FEVER, CHILLS, HEMATOCHEZIA, HEMATEMESIS, nausea, vomiting, melena, CHEST PAIN, SHORTNESS OF BREATH, CHANGE IN BOWEL IN HABITS, constipation, abdominal pain, problems swallowing, problems with sedation, OR heartburn or indigestion.  Past Medical History:  Diagnosis Date  . Migraine   . Thyroid disease    Past Surgical History:  Procedure Laterality Date  . ABDOMINAL HYSTERECTOMY    . ESOPHAGOGASTRODUODENOSCOPY N/A 10/31/2014   NWG:NFAOZSLF:small HH/mild non-erosive gastritis/dysphagia due to stricture  . SAVORY DILATION N/A 10/31/2014   Procedure: SAVORY DILATION;  Surgeon: West BaliSandi L Fields, MD;  Location: [facility];  Service: Endoscopy;  Laterality: N/A;   Allergies  Allergen Reactions  . Darvocet [Propoxyphene N-Acetaminophen]    Current Outpatient Prescriptions  Medication Sig Dispense Refill  . atorvastatin (LIPITOR) 10 MG tablet Take 10 mg by mouth daily.     . Cetirizine HCl 10 MG CAPS Take 1 capsule by mouth as needed.     Marland Kitchen. levothyroxine (SYNTHROID, LEVOTHROID) 50 MCG tablet Take 50 mcg by mouth daily.     Marland Kitchen. lisinopril (PRINIVIL,ZESTRIL) 2.5 MG tablet Take 2.5 mg by mouth daily.     . metFORMIN (GLUCOPHAGE) 500 MG tablet Take 1 tablet by mouth 2 (two) times daily.    Marland Kitchen. PROTONIX 40 MG tablet ONCE DAILY 30 MINS PRIOR TO FIRST MEAL FOREVER    . phentermine (ADIPEX-P) 37.5 MG tablet Take 37.5 mg by mouth every other day.     . topiramate (TOPAMAX) 100 MG tablet Take 100 mg by mouth at bedtime.    Marland Kitchen. zolpidem (AMBIEN) 5 MG tablet Take 5 mg by mouth at bedtime.    .      .      .      Marland Kitchen.  naproxen (NAPROSYN) 500 MG tablet Take 500 mg by mouth 2 (two) times daily as needed for mild pain.     Review of Systems PER HPI OTHERWISE ALL SYSTEMS ARE NEGATIVE.    Objective:   Physical Exam  Constitutional: She is oriented to person, place, and time. She appears well-developed and well-nourished. No distress.  HENT:  Head: Normocephalic and atraumatic.  Mouth/Throat: Oropharynx is clear and moist. No oropharyngeal exudate.  Eyes: Pupils are equal, round, and reactive to light. No scleral icterus.  Neck: Normal range of motion. Neck supple.  Cardiovascular: Normal rate, regular rhythm and normal heart sounds.   Pulmonary/Chest: Effort normal and breath sounds normal. No respiratory distress.  Abdominal: Soft. Bowel sounds are normal. She exhibits no distension. There is no tenderness.  Musculoskeletal: She exhibits no edema.  Lymphadenopathy:    She has no cervical adenopathy.  Neurological: She is alert and oriented to person, place, and time.  NO  NEW FOCAL DEFICITS. HEARING AIDS IN PLACE.  Psychiatric: She has a normal mood and affect.  Vitals reviewed.     Assessment & Plan:

## 2015-12-17 NOTE — Assessment & Plan Note (Signed)
SYMPTOMS FAIRLY WELL CONTROLLED.  CONTINUE OMEPRAZOLE.  TAKE 30 MINUTES PRIOR TO YOUR FIRST MEAL. FOLLOW UP IN 1 YEAR.  

## 2015-12-17 NOTE — Assessment & Plan Note (Signed)
SYMPTOMS CONTROLLED/RESOLVED.  CONTINUE TO MONITOR SYMPTOMS. 

## 2015-12-17 NOTE — Patient Instructions (Signed)
COMPLETE COLONOSCOPY IN OCT 2017.  CONTINUE OMEPRAZOLE.  TAKE 30 MINUTES PRIOR TO YOUR FIRST MEAL.  DRINK PREP ON DAY BEFORE COLONOSCOPY.  FOLLOW A FULL LIQUID DIET ON DAY BEFORE YOUR COLONOSCOPY. CONTINUE YOUR METFORMIN.  FOLLOW UP IN OCT 2018.   Full Liquid Diet   Breads and Starches  Allowed: None are allowed    Avoid: Any others.    Potatoes/Pasta/Rice  Allowed: ANY ITEM AS A SOUP OR SMALL PLATE OF MASHED POTATOES OR SCRAMBLED EGGS.       Vegetables  Allowed: Strained tomato or vegetable juice. Vegetables pureed in soup.   Avoid: Any others.    Fruit  Allowed: Any strained fruit juices and fruit drinks. Include 1 serving of citrus or vitamin C-enriched fruit juice daily.   Avoid: Any others.  Meat and Meat Substitutes  Allowed: Egg  Avoid: Any meat, fish, or fowl. All cheese.  Milk  Allowed: SOY Milk beverages, including milk shakes and instant breakfast mixes. Smooth yogurt.   Avoid: Any others. Avoid dairy products if not tolerated.    Soups and Combination Foods  Allowed: Broth, strained cream soups. Strained, broth-based soups.   Avoid: Any others.    Desserts and Sweets  Allowed: flavored gelatin, tapioca, ice cream, sherbet, smooth pudding, junket, fruit ices, frozen ice pops, pudding pops, frozen fudge pops, chocolate syrup. Sugar, honey, jelly, syrup.   Avoid: Any others.  Fats and Oils  Allowed: Margarine, butter, cream, sour cream, oils.   Avoid: Any others.  Beverages  Allowed: All.   Avoid: None.  Condiments  Allowed: Iodized salt, pepper, spices, flavorings. Cocoa powder.   Avoid: Any others.    SAMPLE MEAL PLAN Breakfast   cup orange juice.   1 OR 2 EGGS  1 cup milk.   1 cup beverage (coffee or tea).   Cream or sugar, if desired.    Midmorning Snack  2 SCRAMBLED OR HARD BOILED EGG   Lunch  1 cup cream soup.    cup fruit juice.   1 cup milk.    cup custard.   1 cup beverage (coffee or  tea).   Cream or sugar, if desired.    Midafternoon Snack  1 cup milk shake.  Dinner  1 cup cream soup.    cup fruit juice.   1 cup MILK    cup pudding.   1 cup beverage (coffee or tea).   Cream or sugar, if desired.  Evening Snack  1 cup supplement.  To increase calories, add sugar, cream, butter, or margarine if possible. Nutritional supplements will also increase the total calories.

## 2015-12-17 NOTE — Assessment & Plan Note (Signed)
AVERAGE RISK-NO WARNING SIGNS/SYMPTOMS  COMPLETE COLONOSCOPY IN OCT 2017. DISCUSSED PROCEDURE, BENEFITS, & RISKS: < 1% chance of medication reaction, bleeding, perforation, or rupture of spleen/liver. CONTINUE OMEPRAZOLE.  TAKE 30 MINUTES PRIOR TO YOUR FIRST MEAL.  DRINK PREP ON DAY BEFORE COLONOSCOPY. FOLLOW A FULL LIQUID DIET ON DAY BEFORE COLONOSCOPY. CONTINUE METFORMIN.  HANDOUT GIVEN. FOLLOW UP IN OCT 2018.

## 2015-12-18 NOTE — Progress Notes (Signed)
CC'ED TO PCP 

## 2016-01-12 ENCOUNTER — Telehealth: Payer: Self-pay

## 2016-01-12 NOTE — Telephone Encounter (Signed)
Pt had OV 12/17/15, [facility] recommended TCS in Oct 2017. Pt was going to think about it and call. Called pt to see if she wanted to set-up TCS. She said she doesn't want to do it now, maybe after first of the year.

## 2016-01-12 NOTE — Telephone Encounter (Signed)
REVIEWED-NO ADDITIONAL RECOMMENDATIONS. 

## 2016-01-26 DIAGNOSIS — E063 Autoimmune thyroiditis: Secondary | ICD-10-CM | POA: Diagnosis not present

## 2016-01-26 DIAGNOSIS — G43909 Migraine, unspecified, not intractable, without status migrainosus: Secondary | ICD-10-CM | POA: Diagnosis not present

## 2016-01-26 DIAGNOSIS — K219 Gastro-esophageal reflux disease without esophagitis: Secondary | ICD-10-CM | POA: Diagnosis not present

## 2016-01-26 DIAGNOSIS — E1165 Type 2 diabetes mellitus with hyperglycemia: Secondary | ICD-10-CM | POA: Diagnosis not present

## 2016-01-26 DIAGNOSIS — B353 Tinea pedis: Secondary | ICD-10-CM | POA: Diagnosis not present

## 2016-01-26 DIAGNOSIS — Z23 Encounter for immunization: Secondary | ICD-10-CM | POA: Diagnosis not present

## 2016-01-26 DIAGNOSIS — Z6831 Body mass index (BMI) 31.0-31.9, adult: Secondary | ICD-10-CM | POA: Diagnosis not present

## 2016-01-26 DIAGNOSIS — E6609 Other obesity due to excess calories: Secondary | ICD-10-CM | POA: Diagnosis not present

## 2016-01-26 DIAGNOSIS — Z1389 Encounter for screening for other disorder: Secondary | ICD-10-CM | POA: Diagnosis not present

## 2016-01-27 DIAGNOSIS — Z23 Encounter for immunization: Secondary | ICD-10-CM | POA: Diagnosis not present

## 2016-01-27 DIAGNOSIS — E1165 Type 2 diabetes mellitus with hyperglycemia: Secondary | ICD-10-CM | POA: Diagnosis not present

## 2016-01-27 DIAGNOSIS — Z1389 Encounter for screening for other disorder: Secondary | ICD-10-CM | POA: Diagnosis not present

## 2016-01-27 DIAGNOSIS — E6609 Other obesity due to excess calories: Secondary | ICD-10-CM | POA: Diagnosis not present

## 2016-01-27 DIAGNOSIS — Z6831 Body mass index (BMI) 31.0-31.9, adult: Secondary | ICD-10-CM | POA: Diagnosis not present

## 2016-02-05 DIAGNOSIS — R945 Abnormal results of liver function studies: Secondary | ICD-10-CM | POA: Diagnosis not present

## 2016-02-19 DIAGNOSIS — L501 Idiopathic urticaria: Secondary | ICD-10-CM | POA: Diagnosis not present

## 2016-02-19 DIAGNOSIS — Z91018 Allergy to other foods: Secondary | ICD-10-CM | POA: Diagnosis not present

## 2016-02-19 DIAGNOSIS — J3089 Other allergic rhinitis: Secondary | ICD-10-CM | POA: Diagnosis not present

## 2016-02-19 DIAGNOSIS — J301 Allergic rhinitis due to pollen: Secondary | ICD-10-CM | POA: Diagnosis not present

## 2016-02-23 DIAGNOSIS — Z0100 Encounter for examination of eyes and vision without abnormal findings: Secondary | ICD-10-CM | POA: Diagnosis not present

## 2016-05-02 DIAGNOSIS — N3000 Acute cystitis without hematuria: Secondary | ICD-10-CM | POA: Diagnosis not present

## 2016-05-02 DIAGNOSIS — Z1389 Encounter for screening for other disorder: Secondary | ICD-10-CM | POA: Diagnosis not present

## 2016-05-02 DIAGNOSIS — B353 Tinea pedis: Secondary | ICD-10-CM | POA: Diagnosis not present

## 2016-05-02 DIAGNOSIS — E1165 Type 2 diabetes mellitus with hyperglycemia: Secondary | ICD-10-CM | POA: Diagnosis not present

## 2016-05-02 DIAGNOSIS — K219 Gastro-esophageal reflux disease without esophagitis: Secondary | ICD-10-CM | POA: Diagnosis not present

## 2016-05-02 DIAGNOSIS — E6609 Other obesity due to excess calories: Secondary | ICD-10-CM | POA: Diagnosis not present

## 2016-05-02 DIAGNOSIS — E119 Type 2 diabetes mellitus without complications: Secondary | ICD-10-CM | POA: Diagnosis not present

## 2016-05-02 DIAGNOSIS — Z6830 Body mass index (BMI) 30.0-30.9, adult: Secondary | ICD-10-CM | POA: Diagnosis not present

## 2016-07-01 ENCOUNTER — Emergency Department (HOSPITAL_COMMUNITY): Payer: Commercial Managed Care - HMO

## 2016-07-01 ENCOUNTER — Emergency Department (HOSPITAL_COMMUNITY)
Admission: EM | Admit: 2016-07-01 | Discharge: 2016-07-01 | Disposition: A | Discharge status: Home / Self Care | Payer: Commercial Managed Care - HMO | Attending: Emergency Medicine | Admitting: Emergency Medicine

## 2016-07-01 ENCOUNTER — Encounter (HOSPITAL_COMMUNITY): Payer: Self-pay | Admitting: Emergency Medicine

## 2016-07-01 DIAGNOSIS — J40 Bronchitis, not specified as acute or chronic: Secondary | ICD-10-CM

## 2016-07-01 DIAGNOSIS — Z7984 Long term (current) use of oral hypoglycemic drugs: Secondary | ICD-10-CM | POA: Diagnosis not present

## 2016-07-01 DIAGNOSIS — R079 Chest pain, unspecified: Secondary | ICD-10-CM | POA: Diagnosis not present

## 2016-07-01 DIAGNOSIS — Z79899 Other long term (current) drug therapy: Secondary | ICD-10-CM | POA: Diagnosis not present

## 2016-07-01 DIAGNOSIS — R05 Cough: Secondary | ICD-10-CM | POA: Diagnosis not present

## 2016-07-01 MED ORDER — DEXAMETHASONE 4 MG PO TABS: 4.0000 mg | ORAL_TABLET | Freq: Two times a day (BID) | ORAL | 0 refills | Status: DC

## 2016-07-01 MED ORDER — LORATADINE-PSEUDOEPHEDRINE ER 5-120 MG PO TB12: 1.0000 | ORAL_TABLET | Freq: Two times a day (BID) | ORAL | 0 refills | Status: DC

## 2016-07-01 MED ORDER — HYDROCOD POLST-CPM POLST ER 10-8 MG/5ML PO SUER
5.0000 mL | Freq: Once | ORAL | Status: AC
  Administered 2016-07-01: 5 mL via ORAL
  Filled 2016-07-01: qty 5

## 2016-07-01 MED ORDER — DEXAMETHASONE SODIUM PHOSPHATE 4 MG/ML IJ SOLN
8.0000 mg | Freq: Once | INTRAMUSCULAR | Status: AC
  Administered 2016-07-01: 8 mg via INTRAMUSCULAR
  Filled 2016-07-01: qty 2

## 2016-07-01 MED ORDER — HYDROCODONE-HOMATROPINE 5-1.5 MG/5ML PO SYRP: 5.0000 mL | ORAL_SOLUTION | Freq: Four times a day (QID) | ORAL | 0 refills | Status: DC | PRN

## 2016-07-01 MED ORDER — PSEUDOEPHEDRINE HCL 60 MG PO TABS
60.0000 mg | ORAL_TABLET | Freq: Once | ORAL | Status: AC
  Administered 2016-07-01: 60 mg via ORAL
  Filled 2016-07-01: qty 1

## 2016-07-01 NOTE — ED Provider Notes (Signed)
[facility] Provider Note   CSN: [id number] Arrival date & time: 07/01/16  1545     History   Chief Complaint Chief Complaint  Patient presents with  . Cough    HPI Gina Osborne is a 56 y.o. female.  Patient is a 56 year old female who presents to the emergency department with a complaint of cough and congestion.  The patient states that she has been having problems with cough and congestion for more than 2 weeks. She states that the problem seems to come and go. She has been trying over-the-counter medications, but these have not been effective in resolving her cough problem. She states now she is coughing to the extent of vomiting after coughing for a long period of time. She also reports some soreness in her rib area and chest wall. She's not seen any blood in her mucus. Neither was she seen blood in her vomitus. She states that she has had some mild temperature changes, no other problems reported.      Past Medical History:  Diagnosis Date  . Migraine   . Thyroid disease     Patient Active Problem List   Diagnosis Date Noted  . GERD (gastroesophageal reflux disease) 12/17/2015  . Colon cancer screening 12/17/2015  . Gastritis 12/30/2014  . Abnormal esophagram 09/29/2014  . Dysphagia 09/29/2014    Past Surgical History:  Procedure Laterality Date  . ABDOMINAL HYSTERECTOMY    . ESOPHAGOGASTRODUODENOSCOPY N/A 10/31/2014   WUJ:WJXBJ HH/mild non-erosive gastritis/dysphagia due to stricture  . SAVORY DILATION N/A 10/31/2014   Procedure: SAVORY DILATION;  Surgeon: West Bali, MD;  Location: [facility];  Service: Endoscopy;  Laterality: N/A;    OB History    No data available       Home Medications    Prior to Admission medications   Medication Sig Start Date End Date Taking? Authorizing Provider  atorvastatin (LIPITOR) 10 MG tablet Take 10 mg by mouth daily.  08/28/14   Historical Provider, MD  cephALEXin (KEFLEX) 500 MG capsule Take 1 capsule (500  mg total) by mouth 4 (four) times daily. Patient not taking: Reported on 12/17/2015 07/18/15   Ivery Quale, PA-C  Cetirizine HCl 10 MG CAPS Take 1 capsule by mouth as needed.     Historical Provider, MD  dexamethasone (DECADRON) 4 MG tablet Take 1 tablet (4 mg total) by mouth 2 (two) times daily with a meal. 07/01/16   Ivery Quale, PA-C  fluticasone (FLONASE) 50 MCG/ACT nasal spray Place 2 sprays into both nostrils daily. Patient not taking: Reported on 12/17/2015 06/23/15   Kristen N Ward, DO  guaiFENesin-codeine 100-10 MG/5ML syrup Take 5-10 mLs by mouth every 6 (six) hours as needed for cough. Patient not taking: Reported on 07/17/2015 06/23/15   Kristen N Ward, DO  HYDROcodone-homatropine [facility]) 5-1.5 MG/5ML syrup Take 5 mLs by mouth every 6 (six) hours as needed. 07/01/16   Ivery Quale, PA-C  levothyroxine (SYNTHROID, LEVOTHROID) 50 MCG tablet Take 50 mcg by mouth daily.     Historical Provider, MD  lisinopril (PRINIVIL,ZESTRIL) 2.5 MG tablet Take 2.5 mg by mouth daily.  08/28/14   Historical Provider, MD  loratadine-pseudoephedrine (CLARITIN-D 12 HOUR) 5-120 MG tablet Take 1 tablet by mouth 2 (two) times daily. 07/01/16   Ivery Quale, PA-C  metFORMIN (GLUCOPHAGE) 500 MG tablet Take 1 tablet by mouth 2 (two) times daily. 10/26/15   Historical Provider, MD  naproxen (NAPROSYN) 500 MG tablet Take 500 mg by mouth 2 (two) times daily  as needed for mild pain.    Historical Provider, MD  pantoprazole (PROTONIX) 40 MG tablet TAKE ONE TABLET BY MOUTH ONCE DAILY 30 MINUTES  PRIOR TO YOUR FIRST MEAL FOREVER 11/16/15   Anice PaganiniEric A Gill, NP  phentermine (ADIPEX-P) 37.5 MG tablet Take 37.5 mg by mouth every other day.     Historical Provider, MD  topiramate (TOPAMAX) 100 MG tablet Take 100 mg by mouth at bedtime.    Historical Provider, MD  zolpidem (AMBIEN) 5 MG tablet Take 5 mg by mouth at bedtime.    Historical Provider, MD    Family History No family history on file.  Social History Social History    Substance Use Topics  . Smoking status: Never Smoker  . Smokeless tobacco: Never Used  . Alcohol use No     Allergies   Darvocet [propoxyphene n-acetaminophen]   Review of Systems Review of Systems  Constitutional: Positive for appetite change and chills.  HENT: Positive for congestion and postnasal drip.   Respiratory: Positive for cough.   All other systems reviewed and are negative.    Physical Exam Updated Vital Signs BP 90/65 (BP Location: Right Arm)   Pulse 88   Temp 98.4 F (36.9 C) (Oral)   Resp 18   Ht 5\' 3"  (1.6 m)   Wt 75.3 kg   SpO2 99%   BMI 29.41 kg/m   Physical Exam  Constitutional: She is oriented to person, place, and time. She appears well-developed and well-nourished.  Non-toxic appearance.  HENT:  Head: Normocephalic.  Right Ear: Tympanic membrane and external ear normal.  Left Ear: Tympanic membrane and external ear normal.  Nasal congestion present. Hearing aids noted in both ears.  Eyes: EOM and lids are normal. Pupils are equal, round, and reactive to light.  Neck: Normal range of motion. Neck supple. Carotid bruit is not present.  Cardiovascular: Normal rate, regular rhythm, normal heart sounds, intact distal pulses and normal pulses.   Pulmonary/Chest: Breath sounds normal. No respiratory distress.  There is symmetrical rise and fall of the chest. Patient speaks in complete sentences without problem. There are coarse breath sounds with a few scattered rhonchi present.  Abdominal: Soft. Bowel sounds are normal. There is no tenderness. There is no guarding.  Musculoskeletal: Normal range of motion.  Lymphadenopathy:       Head (right side): No submandibular adenopathy present.       Head (left side): No submandibular adenopathy present.    She has no cervical adenopathy.  Neurological: She is alert and oriented to person, place, and time. She has normal strength. No cranial nerve deficit or sensory deficit.  Skin: Skin is warm and dry.   Psychiatric: She has a normal mood and affect. Her speech is normal.  Nursing note and vitals reviewed.    ED Treatments / Results  Labs (all labs ordered are listed, but only abnormal results are displayed) Labs Reviewed - No data to display  EKG  EKG Interpretation None       Radiology Dg Chest 2 View  Result Date: 07/01/2016 CLINICAL DATA:  Productive cough. Mid chest pain. One week duration. EXAM: CHEST  2 VIEW COMPARISON:  06/23/2015 FINDINGS: Heart size is normal. Mediastinal shadows are normal. The lungs are clear. No bronchial thickening. No infiltrate, mass, effusion or collapse. Pulmonary vascularity is normal. No bony abnormality. IMPRESSION: Normal chest Electronically Signed   By: Paulina FusiMark  Shogry M.D.   On: 07/01/2016 16:29    Procedures Procedures (including critical care  time)  Medications Ordered in ED Medications  pseudoephedrine (SUDAFED) tablet 60 mg (60 mg Oral Given 07/01/16 1951)  dexamethasone (DECADRON) injection 8 mg (8 mg Intramuscular Given 07/01/16 1952)  chlorpheniramine-HYDROcodone (TUSSIONEX) 10-8 MG/5ML suspension 5 mL (5 mLs Oral Given 07/01/16 1951)     Initial Impression / Assessment and Plan / ED Course  I have reviewed the triage vital signs and the nursing notes.  Pertinent labs & imaging results that were available during my care of the patient were reviewed by me and considered in my medical decision making (see chart for details).     *I have reviewed nursing notes, vital signs, and all appropriate lab and imaging results for this patient.**  Final Clinical Impressions(s) / ED Diagnoses MDM Pulse oximetry is 99% on room air. Vital signs reviewed. The chest x-ray is negative for acute problems. There is some question of bronchitis present. The patient speaks in complete sentences without problem. I discussed the findings with the patient and her significant other in terms which they understand. The patient will be prescribed Hycodan,  Decadron, and Claritin-D on. The patient will follow-up with her primary physician next week for recheck. Patient is in agreement with this plan.    Final diagnoses:  Bronchitis    New Prescriptions New Prescriptions   DEXAMETHASONE (DECADRON) 4 MG TABLET    Take 1 tablet (4 mg total) by mouth 2 (two) times daily with a meal.   HYDROCODONE-HOMATROPINE (HYCODAN) 5-1.5 MG/5ML SYRUP    Take 5 mLs by mouth every 6 (six) hours as needed.   LORATADINE-PSEUDOEPHEDRINE (CLARITIN-D 12 HOUR) 5-120 MG TABLET    Take 1 tablet by mouth 2 (two) times daily.     Ivery Quale, PA-C 07/01/16 2004    Bethann Berkshire, MD 07/01/16 [phone]

## 2016-07-01 NOTE — ED Notes (Signed)
Recheck: NAD, conversant, no coughing noted

## 2016-07-01 NOTE — ED Triage Notes (Signed)
Cough and chest congestion that started x2 weeks ago,  Felt better about 3-4 days ago, now symptoms have started back.

## 2016-07-01 NOTE — Discharge Instructions (Signed)
Your x-ray is negative for pneumonia or other acute problems. There does appear to be some bronchitis present. Your vital signs within normal limits. Your oxygen level is 99% on room air. Please increase water, Gatorade, Kool-Aid, etc. Please maintain good hydration. Use Tylenol every 4 hours, or ibuprofen every 6 hours for fever or aching. Please use Decadron, Claritin-D for congestion, and Hycodan for cough. Please see Dr. Sherwood GamblerFusco next week for recheck and follow up of your bronchitis. Wash hands frequently.

## 2016-07-12 DIAGNOSIS — R05 Cough: Secondary | ICD-10-CM | POA: Diagnosis not present

## 2016-07-12 DIAGNOSIS — Z1389 Encounter for screening for other disorder: Secondary | ICD-10-CM | POA: Diagnosis not present

## 2016-07-12 DIAGNOSIS — E663 Overweight: Secondary | ICD-10-CM | POA: Diagnosis not present

## 2016-07-12 DIAGNOSIS — Z6829 Body mass index (BMI) 29.0-29.9, adult: Secondary | ICD-10-CM | POA: Diagnosis not present

## 2016-07-12 DIAGNOSIS — J329 Chronic sinusitis, unspecified: Secondary | ICD-10-CM | POA: Diagnosis not present

## 2016-07-12 DIAGNOSIS — K219 Gastro-esophageal reflux disease without esophagitis: Secondary | ICD-10-CM | POA: Diagnosis not present

## 2016-07-15 ENCOUNTER — Other Ambulatory Visit (HOSPITAL_COMMUNITY): Payer: Self-pay | Admitting: Internal Medicine

## 2016-07-15 DIAGNOSIS — Z1231 Encounter for screening mammogram for malignant neoplasm of breast: Secondary | ICD-10-CM

## 2016-07-20 ENCOUNTER — Ambulatory Visit (HOSPITAL_COMMUNITY): Payer: Commercial Managed Care - HMO

## 2016-07-22 ENCOUNTER — Ambulatory Visit (HOSPITAL_COMMUNITY)
Admission: RE | Admit: 2016-07-22 | Discharge: 2016-07-22 | Disposition: A | Discharge status: Home / Self Care | Payer: Commercial Managed Care - HMO | Source: Ambulatory Visit | Attending: Internal Medicine | Admitting: Internal Medicine

## 2016-07-22 DIAGNOSIS — Z1231 Encounter for screening mammogram for malignant neoplasm of breast: Secondary | ICD-10-CM | POA: Insufficient documentation

## 2016-08-01 DIAGNOSIS — H35372 Puckering of macula, left eye: Secondary | ICD-10-CM | POA: Diagnosis not present

## 2016-11-21 ENCOUNTER — Encounter: Payer: Self-pay | Admitting: Gastroenterology

## 2016-12-08 DIAGNOSIS — E6609 Other obesity due to excess calories: Secondary | ICD-10-CM | POA: Diagnosis not present

## 2016-12-08 DIAGNOSIS — G4700 Insomnia, unspecified: Secondary | ICD-10-CM | POA: Diagnosis not present

## 2016-12-08 DIAGNOSIS — Z6831 Body mass index (BMI) 31.0-31.9, adult: Secondary | ICD-10-CM | POA: Diagnosis not present

## 2016-12-08 DIAGNOSIS — I10 Essential (primary) hypertension: Secondary | ICD-10-CM | POA: Diagnosis not present

## 2016-12-08 DIAGNOSIS — E119 Type 2 diabetes mellitus without complications: Secondary | ICD-10-CM | POA: Diagnosis not present

## 2017-02-24 DIAGNOSIS — Z91018 Allergy to other foods: Secondary | ICD-10-CM | POA: Diagnosis not present

## 2017-02-24 DIAGNOSIS — J3089 Other allergic rhinitis: Secondary | ICD-10-CM | POA: Diagnosis not present

## 2017-02-24 DIAGNOSIS — L501 Idiopathic urticaria: Secondary | ICD-10-CM | POA: Diagnosis not present

## 2017-02-24 DIAGNOSIS — J301 Allergic rhinitis due to pollen: Secondary | ICD-10-CM | POA: Diagnosis not present

## 2017-02-27 DIAGNOSIS — Z0100 Encounter for examination of eyes and vision without abnormal findings: Secondary | ICD-10-CM | POA: Diagnosis not present

## 2017-02-27 DIAGNOSIS — H35372 Puckering of macula, left eye: Secondary | ICD-10-CM | POA: Diagnosis not present

## 2017-03-01 DIAGNOSIS — E063 Autoimmune thyroiditis: Secondary | ICD-10-CM | POA: Diagnosis not present

## 2017-03-01 DIAGNOSIS — K219 Gastro-esophageal reflux disease without esophagitis: Secondary | ICD-10-CM | POA: Diagnosis not present

## 2017-03-01 DIAGNOSIS — Z6832 Body mass index (BMI) 32.0-32.9, adult: Secondary | ICD-10-CM | POA: Diagnosis not present

## 2017-03-01 DIAGNOSIS — Z1389 Encounter for screening for other disorder: Secondary | ICD-10-CM | POA: Diagnosis not present

## 2017-03-01 DIAGNOSIS — E669 Obesity, unspecified: Secondary | ICD-10-CM | POA: Diagnosis not present

## 2017-03-01 DIAGNOSIS — Z23 Encounter for immunization: Secondary | ICD-10-CM | POA: Diagnosis not present

## 2017-03-01 DIAGNOSIS — E119 Type 2 diabetes mellitus without complications: Secondary | ICD-10-CM | POA: Diagnosis not present

## 2017-03-06 DIAGNOSIS — I10 Essential (primary) hypertension: Secondary | ICD-10-CM | POA: Diagnosis not present

## 2017-03-06 DIAGNOSIS — N202 Calculus of kidney with calculus of ureter: Secondary | ICD-10-CM | POA: Diagnosis not present

## 2017-03-06 DIAGNOSIS — Z1389 Encounter for screening for other disorder: Secondary | ICD-10-CM | POA: Diagnosis not present

## 2017-03-06 DIAGNOSIS — E063 Autoimmune thyroiditis: Secondary | ICD-10-CM | POA: Diagnosis not present

## 2017-03-06 DIAGNOSIS — Z23 Encounter for immunization: Secondary | ICD-10-CM | POA: Diagnosis not present

## 2017-03-06 DIAGNOSIS — E119 Type 2 diabetes mellitus without complications: Secondary | ICD-10-CM | POA: Diagnosis not present

## 2017-04-06 DIAGNOSIS — Z1389 Encounter for screening for other disorder: Secondary | ICD-10-CM | POA: Diagnosis not present

## 2017-04-06 DIAGNOSIS — Z6832 Body mass index (BMI) 32.0-32.9, adult: Secondary | ICD-10-CM | POA: Diagnosis not present

## 2017-04-06 DIAGNOSIS — E6609 Other obesity due to excess calories: Secondary | ICD-10-CM | POA: Diagnosis not present

## 2017-04-06 DIAGNOSIS — K219 Gastro-esophageal reflux disease without esophagitis: Secondary | ICD-10-CM | POA: Diagnosis not present

## 2017-04-06 DIAGNOSIS — E119 Type 2 diabetes mellitus without complications: Secondary | ICD-10-CM | POA: Diagnosis not present

## 2017-04-06 DIAGNOSIS — I10 Essential (primary) hypertension: Secondary | ICD-10-CM | POA: Diagnosis not present

## 2017-08-07 DIAGNOSIS — E1165 Type 2 diabetes mellitus with hyperglycemia: Secondary | ICD-10-CM | POA: Diagnosis not present

## 2017-08-07 DIAGNOSIS — E663 Overweight: Secondary | ICD-10-CM | POA: Diagnosis not present

## 2017-08-07 DIAGNOSIS — E063 Autoimmune thyroiditis: Secondary | ICD-10-CM | POA: Diagnosis not present

## 2017-08-07 DIAGNOSIS — E669 Obesity, unspecified: Secondary | ICD-10-CM | POA: Diagnosis not present

## 2017-08-07 DIAGNOSIS — I10 Essential (primary) hypertension: Secondary | ICD-10-CM | POA: Diagnosis not present

## 2017-08-07 DIAGNOSIS — K219 Gastro-esophageal reflux disease without esophagitis: Secondary | ICD-10-CM | POA: Diagnosis not present

## 2017-08-07 DIAGNOSIS — E119 Type 2 diabetes mellitus without complications: Secondary | ICD-10-CM | POA: Diagnosis not present

## 2017-08-07 DIAGNOSIS — Z1389 Encounter for screening for other disorder: Secondary | ICD-10-CM | POA: Diagnosis not present

## 2017-08-07 DIAGNOSIS — Z6829 Body mass index (BMI) 29.0-29.9, adult: Secondary | ICD-10-CM | POA: Diagnosis not present

## 2017-10-22 IMAGING — DX DG CHEST 2V
2 series · 2 of 2 positions shown · non-contrast
Comparison: 06/23/2015

CLINICAL DATA: Productive cough. Mid chest pain. One week duration.

EXAM:
CHEST  2 VIEW

[chest pa]
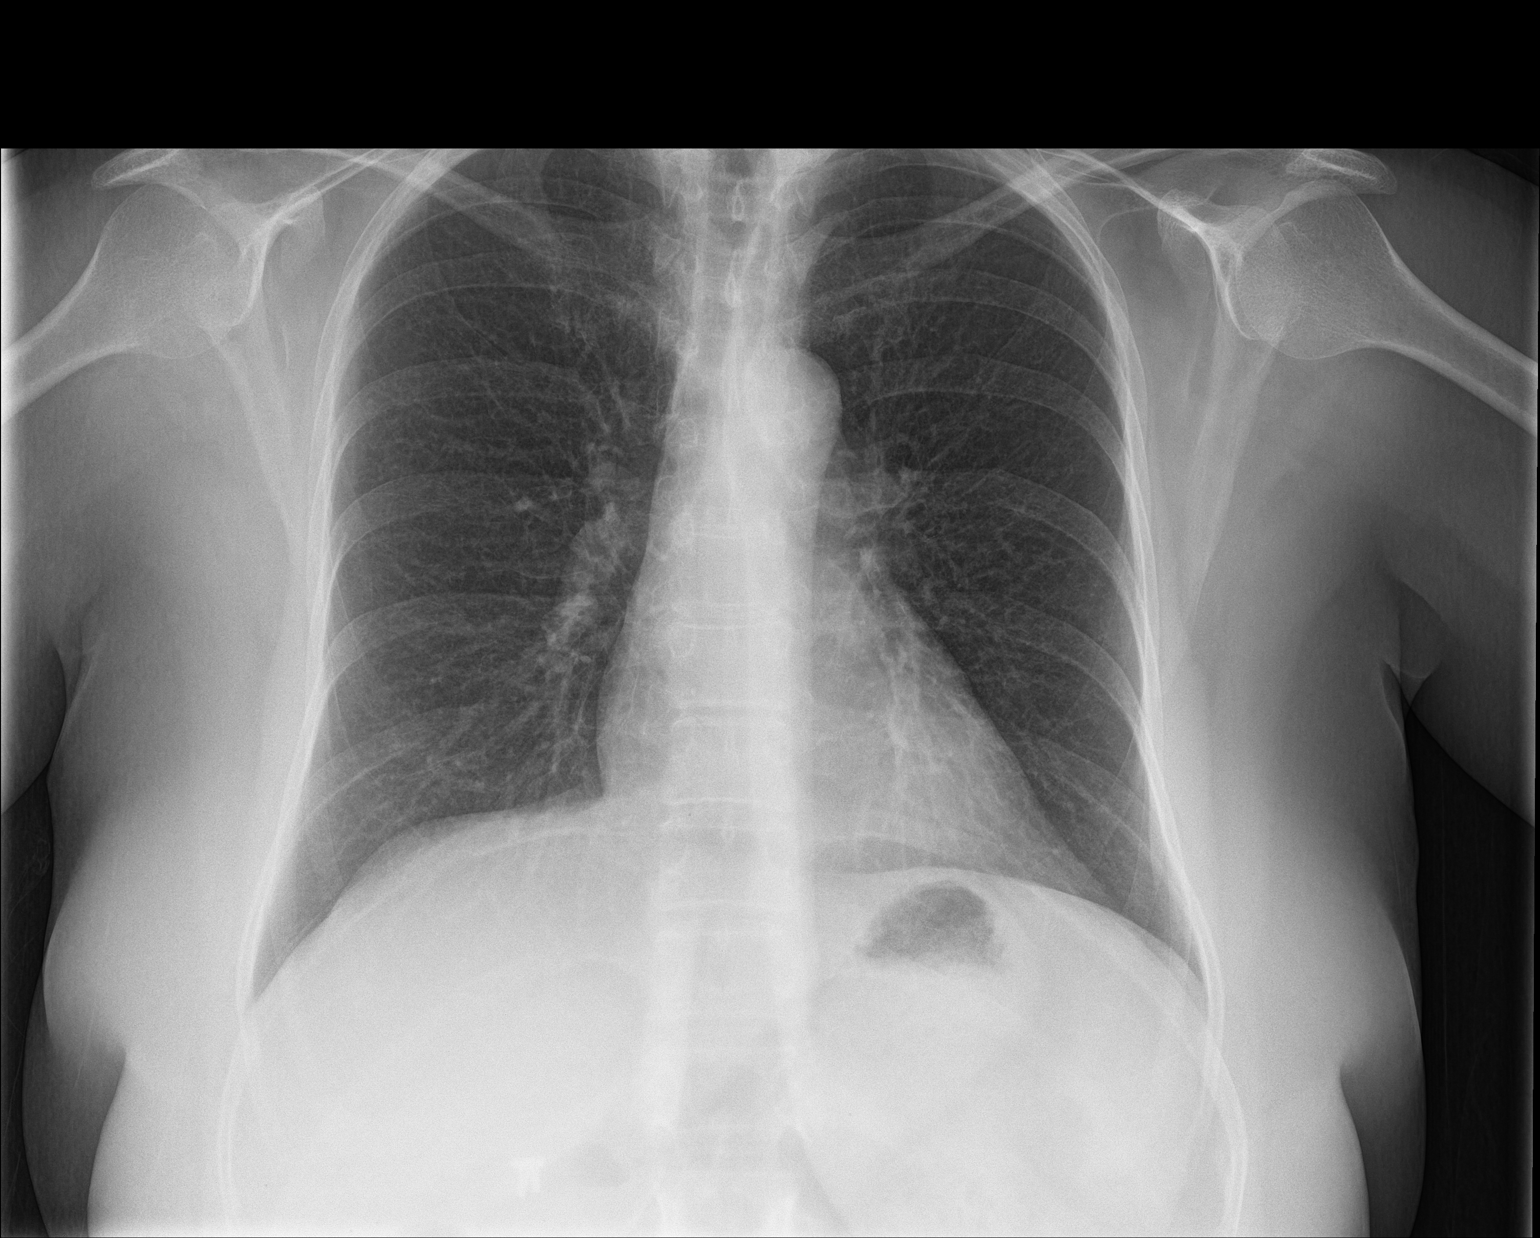

[chest lat]
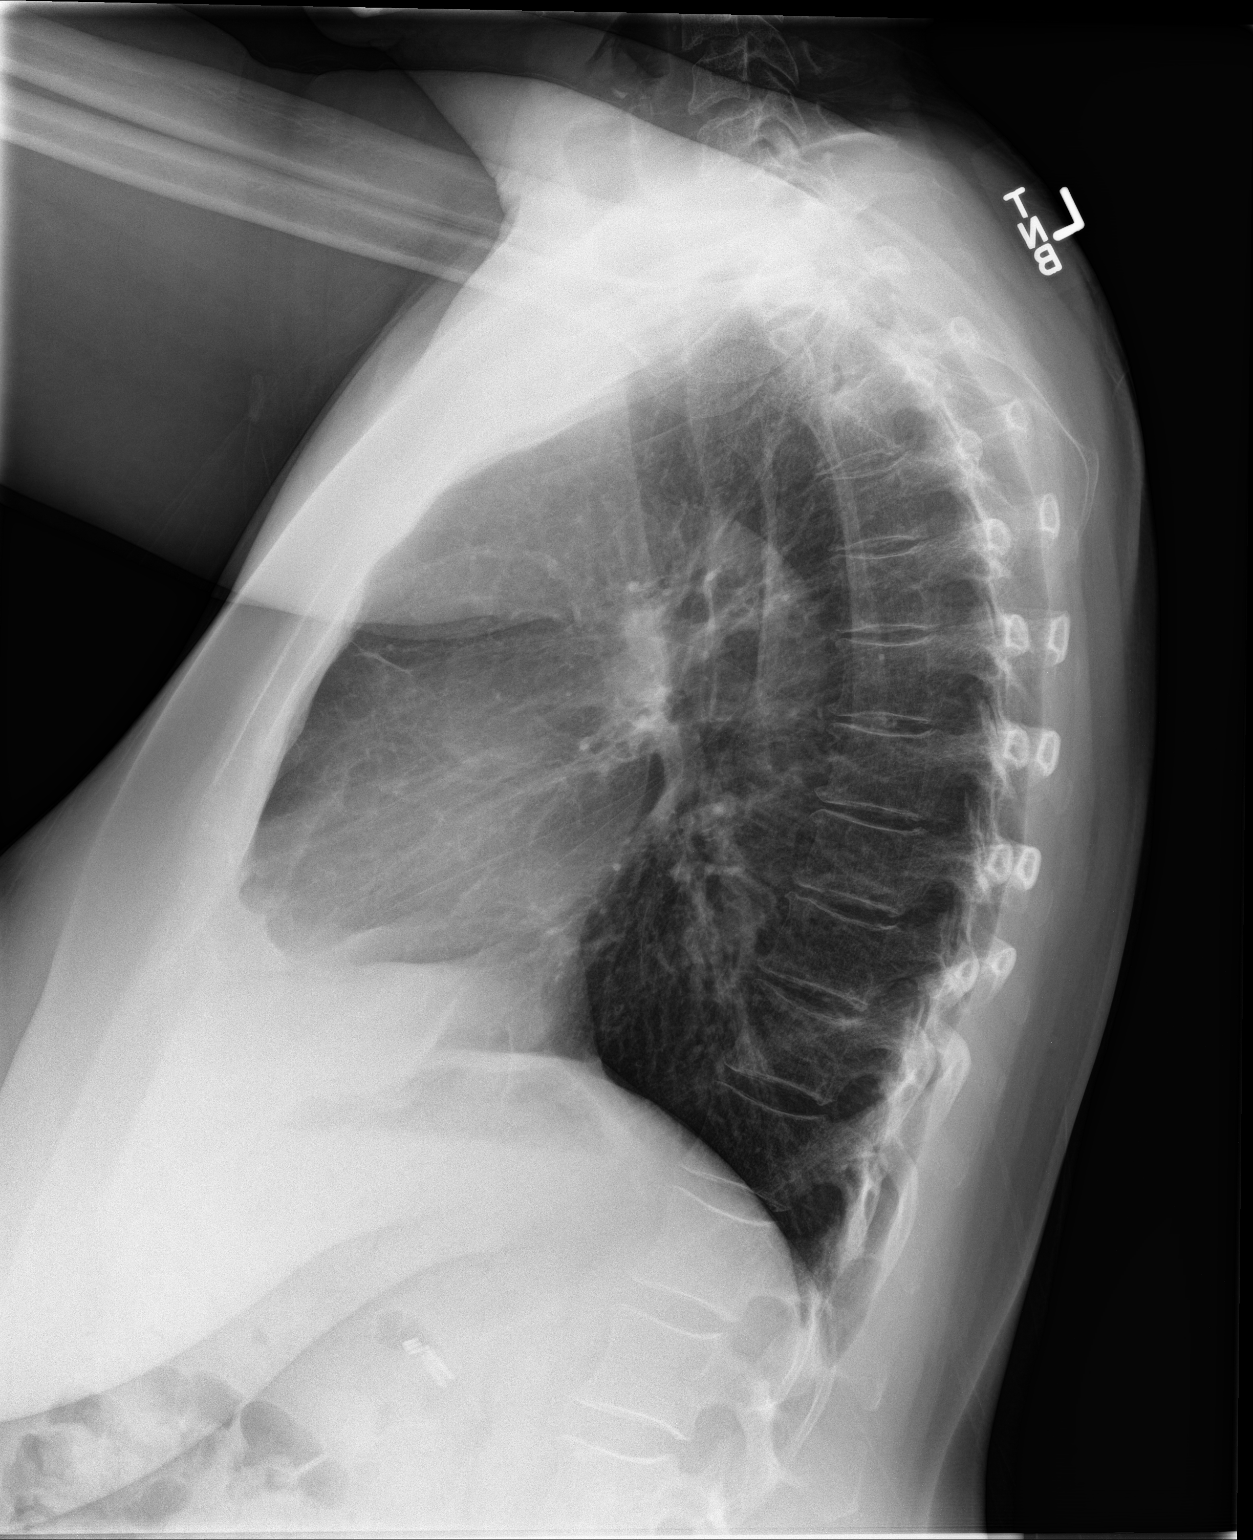

[2 of 2 positions shown; findings below may reference images not displayed]

FINDINGS: Heart size is normal. Mediastinal shadows are normal. The lungs are
clear. No bronchial thickening. No infiltrate, mass, effusion or
collapse. Pulmonary vascularity is normal. No bony abnormality.
IMPRESSION: Normal chest

## 2017-10-31 DIAGNOSIS — J45909 Unspecified asthma, uncomplicated: Secondary | ICD-10-CM | POA: Diagnosis not present

## 2017-10-31 DIAGNOSIS — R1310 Dysphagia, unspecified: Secondary | ICD-10-CM | POA: Diagnosis not present

## 2017-10-31 DIAGNOSIS — K219 Gastro-esophageal reflux disease without esophagitis: Secondary | ICD-10-CM | POA: Diagnosis not present

## 2017-10-31 DIAGNOSIS — Z6828 Body mass index (BMI) 28.0-28.9, adult: Secondary | ICD-10-CM | POA: Diagnosis not present

## 2017-10-31 DIAGNOSIS — L84 Corns and callosities: Secondary | ICD-10-CM | POA: Diagnosis not present

## 2017-10-31 DIAGNOSIS — E063 Autoimmune thyroiditis: Secondary | ICD-10-CM | POA: Diagnosis not present

## 2017-10-31 DIAGNOSIS — E663 Overweight: Secondary | ICD-10-CM | POA: Diagnosis not present

## 2017-10-31 DIAGNOSIS — E119 Type 2 diabetes mellitus without complications: Secondary | ICD-10-CM | POA: Diagnosis not present

## 2017-10-31 DIAGNOSIS — Z0001 Encounter for general adult medical examination with abnormal findings: Secondary | ICD-10-CM | POA: Diagnosis not present

## 2017-11-07 DIAGNOSIS — Z0001 Encounter for general adult medical examination with abnormal findings: Secondary | ICD-10-CM | POA: Diagnosis not present

## 2017-11-07 DIAGNOSIS — Z6828 Body mass index (BMI) 28.0-28.9, adult: Secondary | ICD-10-CM | POA: Diagnosis not present

## 2017-11-07 DIAGNOSIS — E663 Overweight: Secondary | ICD-10-CM | POA: Diagnosis not present

## 2017-11-07 DIAGNOSIS — E782 Mixed hyperlipidemia: Secondary | ICD-10-CM | POA: Diagnosis not present

## 2017-11-07 DIAGNOSIS — Z1389 Encounter for screening for other disorder: Secondary | ICD-10-CM | POA: Diagnosis not present

## 2017-11-15 ENCOUNTER — Ambulatory Visit (INDEPENDENT_AMBULATORY_CARE_PROVIDER_SITE_OTHER): Payer: Medicare HMO | Admitting: Gastroenterology

## 2017-11-15 ENCOUNTER — Encounter: Payer: Self-pay | Admitting: Gastroenterology

## 2017-11-15 DIAGNOSIS — K219 Gastro-esophageal reflux disease without esophagitis: Secondary | ICD-10-CM

## 2017-11-15 DIAGNOSIS — R1310 Dysphagia, unspecified: Secondary | ICD-10-CM | POA: Diagnosis not present

## 2017-11-15 DIAGNOSIS — Z1211 Encounter for screening for malignant neoplasm of colon: Secondary | ICD-10-CM | POA: Diagnosis not present

## 2017-11-15 DIAGNOSIS — R1319 Other dysphagia: Secondary | ICD-10-CM

## 2017-11-15 NOTE — Patient Instructions (Signed)
DRINK WATER TO KEEP YOUR URINE LIGHT YELLOW.  AVOID REFLUX TRIGGERS. SEE INFO BELOW.  FOLLOW A LOW FAT DIET. MEATS SHOULD BE BAKED, BROILED, OR BOILED. Avoid fried foods.   CONTINUE PROTONIX. TAKE 30 MINUTES PRIOR TO MEALS at least once AND SOMETIMES TWICE DAILY.  PLEASE CALL WITH QUESTIONS OR CONCERNS.   FOLLOW UP IN 6 MOS.   Lifestyle and home remedies TO CONTROL HEARTBURN You may eliminate or reduce the frequency of heartburn by making the following lifestyle changes:  . Control your weight. Being overweight is a major risk factor for heartburn and GERD. Excess pounds put pressure on your abdomen, pushing up your stomach and causing acid to back up into your esophagus.   . Eat smaller meals. 4 TO 6 MEALS A DAY. This reduces pressure on the lower esophageal sphincter, helping to prevent the valve from opening and acid from washing back into your esophagus.   Allena Earing. Loosen your belt. Clothes that fit tightly around your waist put pressure on your abdomen and the lower esophageal sphincter.   . Eliminate heartburn triggers. Everyone has specific triggers. Common triggers such as fatty or fried foods, spicy food, tomato sauce, carbonated beverages, alcohol, chocolate, mint, garlic, onion, caffeine and nicotine may make heartburn worse.   Marland Kitchen. Avoid stooping or bending. Tying your shoes is OK. Bending over for longer periods to weed your garden isn't, especially soon after eating.   . Don't lie down after a meal. Wait at least three to four hours after eating before going to bed, and don't lie down right after eating.   Alternative medicine . Several home remedies exist for treating GERD, but they provide only temporary relief. They include drinking baking soda (sodium bicarbonate) added to water or drinking other fluids such as baking soda mixed with cream of tartar and water. . Although these liquids create temporary relief by neutralizing, washing away or buffering acids, eventually they  aggravate the situation by adding gas and fluid to your stomach, increasing pressure and causing more acid reflux. Further, adding more sodium to your diet may increase your blood pressure and add stress to your heart, and excessive bicarbonate ingestion can alter the acid-base balance in your body.

## 2017-11-15 NOTE — Assessment & Plan Note (Addendum)
PT DECLINED AT [facility] ON JUL 23.  WILL READDRESS AGAIN AT NEXT OPV.

## 2017-11-15 NOTE — Progress Notes (Signed)
Subjective:    Patient ID: Gina Osborne, female    DOB: [birth year], 57 y.o.   MRN: [id number]  Gina Osborne, Lawrence, MD   HPI Jul 2019 felt like something stuck in her throat/COUGH/THROAT PAIN. HAD A COLD FOR 3-4 DAYS. VOMITED 3 OR 4 TIMES(NO BLOOD). Associated with coughing. ATE AT [employer]. FOOD WENT DOWN AND THEN THREW UP EVERYTHING. TOOK PROTONIX AND DIDN'T COUGH ANYMORE. THE NEXT DAY ATE LIGHT THAT STAYED DOWN. THAT NIGHT ATE FULL MEAL AND THREW UP AGAIN. HAD BEEN COUGHING ALL DAY THAT DAY. FUSCO AND STOPPED COUGHING AND STOPPED THROWING UP. OCCASIONALLY MISSED DOSE BUT NOT A WHOLE WEEK.DOESN'T FEEL LIKE SOMETHING IS STUCK THERE ANYMORE. FELT LIKE A STRING? IT WAS COUGH, COUGH, COUGH AND THEN THREW UP. NOT ASSOCIATED WITH BURNING IN HER CHEST. DENIES EATING FISH AND ATE A CHICKEN LEG. TAKING 2ND DOSE OF PROTONIX SEEMED TO MAKE IT BETTER. STARTED ONLY ONCE A DAY THIS WEEK AND THE COUGH IS TILL GONE. MOUTH STAYS DRY.  PT DENIES FEVER, CHILLS, HEMATOCHEZIA, HEMATEMESIS, nausea, melena, diarrhea, CHEST PAIN, SHORTNESS OF BREATH, CHANGE IN BOWEL IN HABITS, constipation, abdominal pain, problems swallowing, OR heartburn or indigestion.  Past Medical History:  Diagnosis Date  . Migraine   . Thyroid disease    Past Surgical History:  Procedure Laterality Date  . ABDOMINAL HYSTERECTOMY    . ESOPHAGOGASTRODUODENOSCOPY N/A 10/31/2014   WUJ:WJXBJSLF:small HH/mild non-erosive gastritis/dysphagia due to stricture  . SAVORY DILATION N/A 10/31/2014   Procedure: SAVORY DILATION;  Surgeon: West BaliSandi L Carmelo Reidel, MD;  Location: [facility];  Service: Endoscopy;  Laterality: N/A;   Allergies  Allergen Reactions  . Darvocet [Propoxyphene N-Acetaminophen]    Current Outpatient Medications  Medication Sig    . atorvastatin (LIPITOR) 10 MG tablet Take 10 mg by mouth daily.     . Cetirizine HCl 10 MG CAPS Take 1 capsule by mouth as needed.     Marland Kitchen. glimepiride (AMARYL) 2 MG tablet Take 6 mg by mouth daily with breakfast.     . levothyroxine (SYNTHROID, LEVOTHROID) 50 MCG tablet Take 50 mcg by mouth daily.     Marland Kitchen. lisinopril (PRINIVIL,ZESTRIL) 2.5 MG tablet Take 2.5 mg by mouth daily.     . pantoprazole (PROTONIX) 40 MG tablet TAKE ONE TABLET BY MOUTH ONCE or twice DAILY 30 MINUTES  PRIOR TO YOUR FIRST MEAL FOREVER.    Marland Kitchen. phentermine (ADIPEX-P) 37.5 MG tablet Take 37.5 mg by mouth daily before breakfast.     . topiramate (TOPAMAX) 100 MG tablet Take 100 mg by mouth at bedtime.    . traZODone (DESYREL) 50 MG tablet Take 50 mg by mouth at bedtime.    . cephALEXin (KEFLEX) 500 MG capsule Take 1 capsule (500 mg total) by mouth 4 (four) times daily. (Patient not taking: Reported on 12/17/2015)    .      . fluticasone (FLONASE) 50 MCG/ACT nasal spray Place 2 sprays into both nostrils daily. (Patient not taking: Reported on 12/17/2015)    .      .      .      . metFORMIN (GLUCOPHAGE) 500 MG tablet Take 1 tablet by mouth 2 (two) times daily.    . naproxen (NAPROSYN) 500 MG tablet Take 500 mg by mouth 2 (two) times daily as needed for mild pain.    Marland Kitchen. zolpidem (AMBIEN) 5 MG tablet Take 5 mg by mouth at bedtime.     Review of Systems PER HPI OTHERWISE ALL SYSTEMS ARE  NEGATIVE.    Objective:   Physical Exam  Constitutional: She is oriented to person, place, and time. She appears well-developed and well-nourished. No distress.  HENT:  Head: Normocephalic and atraumatic.  Mouth/Throat: Oropharynx is clear and moist. No oropharyngeal exudate.  Eyes: Pupils are equal, round, and reactive to light. No scleral icterus.  Neck: Normal range of motion. Neck supple.  Cardiovascular: Normal rate, regular rhythm and normal heart sounds.  Pulmonary/Chest: Effort normal and breath sounds normal. No respiratory distress.  Abdominal: Soft. Bowel sounds are normal. She exhibits no distension. There is no tenderness.  Musculoskeletal: She exhibits no edema.  Lymphadenopathy:    She has no cervical adenopathy.  Neurological: She is alert  and oriented to person, place, and time.  NO  NEW FOCAL DEFICITS  Psychiatric:  ANXIOUS MOOD, normal AFFECT   Vitals reviewed.     Assessment & Plan:

## 2017-11-15 NOTE — Assessment & Plan Note (Signed)
RECENT FLARE JUL 2019 RESULTING IN COUGHING AND SUBSEQUENTLY VOMITING. PT COULD HAVE A SML FOREIGN BODY LODGED IN HYPOPHARYNX GENERATING COUGH AS WELL. SYMPTOMS ARE NOW CONTROLLED/RESOLVED.   DRINK WATER TO KEEP YOUR URINE LIGHT YELLOW.  AVOID REFLUX TRIGGERS.  HANDOUT GIVEN. FOLLOW A LOW FAT DIET. MEATS SHOULD BE BAKED, BROILED, OR BOILED. Avoid fried foods.  CONTINUE PROTONIX. TAKE 30 MINUTES PRIOR TO MEALS at least once AND SOMETIMES TWICE DAILY. PLEASE CALL WITH QUESTIONS OR CONCERNS.  FOLLOW UP IN 6 MOS.

## 2017-11-15 NOTE — Assessment & Plan Note (Addendum)
SYMPTOMS CONTROLLED/RESOLVED.  CONTINUE TO MONITOR SYMPTOMS. NO INDICATION FOR ENDOSCOPY AT THIS TIME.

## 2017-11-16 NOTE — Progress Notes (Signed)
CC'D TO PCP °

## 2017-11-16 NOTE — Progress Notes (Signed)
PATIENT SCHEDULED  °

## 2018-01-31 DIAGNOSIS — E663 Overweight: Secondary | ICD-10-CM | POA: Diagnosis not present

## 2018-01-31 DIAGNOSIS — Z23 Encounter for immunization: Secondary | ICD-10-CM | POA: Diagnosis not present

## 2018-01-31 DIAGNOSIS — E669 Obesity, unspecified: Secondary | ICD-10-CM | POA: Diagnosis not present

## 2018-01-31 DIAGNOSIS — Z1389 Encounter for screening for other disorder: Secondary | ICD-10-CM | POA: Diagnosis not present

## 2018-01-31 DIAGNOSIS — Z6829 Body mass index (BMI) 29.0-29.9, adult: Secondary | ICD-10-CM | POA: Diagnosis not present

## 2018-01-31 DIAGNOSIS — I10 Essential (primary) hypertension: Secondary | ICD-10-CM | POA: Diagnosis not present

## 2018-01-31 DIAGNOSIS — E119 Type 2 diabetes mellitus without complications: Secondary | ICD-10-CM | POA: Diagnosis not present

## 2018-04-06 DIAGNOSIS — Z91018 Allergy to other foods: Secondary | ICD-10-CM | POA: Diagnosis not present

## 2018-04-06 DIAGNOSIS — L501 Idiopathic urticaria: Secondary | ICD-10-CM | POA: Diagnosis not present

## 2018-04-06 DIAGNOSIS — J3089 Other allergic rhinitis: Secondary | ICD-10-CM | POA: Diagnosis not present

## 2018-04-06 DIAGNOSIS — J301 Allergic rhinitis due to pollen: Secondary | ICD-10-CM | POA: Diagnosis not present

## 2018-05-01 ENCOUNTER — Encounter (HOSPITAL_COMMUNITY): Payer: Self-pay | Admitting: Emergency Medicine

## 2018-05-01 ENCOUNTER — Other Ambulatory Visit: Payer: Self-pay

## 2018-05-01 ENCOUNTER — Emergency Department (HOSPITAL_COMMUNITY)
Admission: EM | Admit: 2018-05-01 | Discharge: 2018-05-01 | Disposition: A | Discharge status: Home / Self Care | Payer: Medicare HMO | Attending: Emergency Medicine | Admitting: Emergency Medicine

## 2018-05-01 DIAGNOSIS — Z79899 Other long term (current) drug therapy: Secondary | ICD-10-CM | POA: Insufficient documentation

## 2018-05-01 DIAGNOSIS — K0889 Other specified disorders of teeth and supporting structures: Secondary | ICD-10-CM | POA: Diagnosis not present

## 2018-05-01 MED ORDER — TRAMADOL HCL 50 MG PO TABS: 50.0000 mg | ORAL_TABLET | Freq: Four times a day (QID) | ORAL | 0 refills | Status: DC | PRN

## 2018-05-01 MED ORDER — PENICILLIN V POTASSIUM 500 MG PO TABS: 500.0000 mg | ORAL_TABLET | Freq: Four times a day (QID) | ORAL | 0 refills | Status: AC

## 2018-05-01 NOTE — ED Provider Notes (Signed)
[facility] EMERGENCY DEPARTMENT Provider Note   CSN: [id number] Arrival date & time: 05/01/18  1605     History   Chief Complaint Chief Complaint  Patient presents with  . Dental Pain    HPI Gina Osborne is a 58 y.o. female.  HPI  Gina Osborne is a 58 y.o. female who presents to the Emergency Department complaining of left lower dental pain for 3 days.  She describes a throbbing pain to the left side of her face that radiates toward her ear.  Her left lower molar recently broke off while eating.  Pain is worse with chewing and with palpation of the tooth.  She has been taking over-the-counter Advil every 6 hours with minimal to no relief.  She states she does not have the funds to see a dentist at this time.  She denies facial swelling, difficulty swallowing or breathing, fever, chills, or neck pain.   Past Medical History:  Diagnosis Date  . Migraine   . Thyroid disease     Patient Active Problem List   Diagnosis Date Noted  . GERD (gastroesophageal reflux disease) 12/17/2015  . Colon cancer screening 12/17/2015  . Gastritis 12/30/2014  . Dysphagia 09/29/2014    Past Surgical History:  Procedure Laterality Date  . ABDOMINAL HYSTERECTOMY    . ESOPHAGOGASTRODUODENOSCOPY N/A 10/31/2014   WUJ:[id number] HH/mild non-erosive gastritis/dysphagia due to stricture  . SAVORY DILATION N/A 10/31/2014   Procedure: SAVORY DILATION;  Surgeon: West Bali, MD;  [facility]: [facility];  Service: Endoscopy;  Laterality: N/A;     OB History   No obstetric history on file.      Home Medications    Prior to Admission medications   Medication Sig Start Date End Date Taking? Authorizing Provider  atorvastatin (LIPITOR) 10 MG tablet Take 10 mg by mouth daily.  08/28/14   [provider]  cephALEXin (KEFLEX) 500 MG capsule Take 1 capsule (500 mg total) by mouth 4 (four) times daily. Patient not taking: Reported on 12/17/2015 07/18/15   Ivery Quale, PA-C  Cetirizine HCl 10 MG CAPS  Take 1 capsule by mouth as needed.     [provider]  dexamethasone (DECADRON) 4 MG tablet Take 1 tablet (4 mg total) by mouth 2 (two) times daily with a meal. Patient not taking: Reported on 11/15/2017 07/01/16   Ivery Quale, PA-C  fluticasone [facility]) 50 MCG/ACT nasal spray Place 2 sprays into both nostrils daily. Patient not taking: Reported on 12/17/2015 06/23/15   Ward, Layla Maw, DO  glimepiride (AMARYL) 2 MG tablet Take 6 mg by mouth daily with breakfast.    [provider]  guaiFENesin-codeine 100-10 MG/5ML syrup Take 5-10 mLs by mouth every 6 (six) hours as needed for cough. Patient not taking: Reported on 07/17/2015 06/23/15   Ward, Layla Maw, DO  HYDROcodone-homatropine (HYCODAN) 5-1.5 MG/5ML syrup Take 5 mLs by mouth every 6 (six) hours as needed. Patient not taking: Reported on 11/15/2017 07/01/16   Ivery Quale, PA-C  levothyroxine (SYNTHROID, LEVOTHROID) 50 MCG tablet Take 50 mcg by mouth daily.     [provider]  lisinopril (PRINIVIL,ZESTRIL) 2.5 MG tablet Take 2.5 mg by mouth daily.  08/28/14   [provider]  loratadine-pseudoephedrine (CLARITIN-D 12 HOUR) 5-120 MG tablet Take 1 tablet by mouth 2 (two) times daily. Patient not taking: Reported on 11/15/2017 07/01/16   Ivery Quale, PA-C  metFORMIN (GLUCOPHAGE) 500 MG tablet Take 1 tablet by mouth 2 (two) times daily. 10/26/15  [provider]  naproxen (NAPROSYN) 500 MG tablet Take 500 mg by mouth 2 (two) times daily as needed for mild pain.    [provider]  pantoprazole (PROTONIX) 40 MG tablet TAKE ONE TABLET BY MOUTH ONCE DAILY 30 MINUTES  PRIOR TO YOUR FIRST MEAL FOREVER Patient taking differently: 1 tablet twice daily 11/16/15   Anice Paganini, NP  phentermine (ADIPEX-P) 37.5 MG tablet Take 37.5 mg by mouth daily before breakfast.     [provider]  topiramate (TOPAMAX) 100 MG tablet Take 100 mg by mouth at bedtime.    [provider]  traZODone (DESYREL)  50 MG tablet Take 50 mg by mouth at bedtime.    [provider]  zolpidem (AMBIEN) 5 MG tablet Take 5 mg by mouth at bedtime.    [provider]    Family History No family history on file.  Social History Social History   Tobacco Use  . Smoking status: Never Smoker  . Smokeless tobacco: Never Used  Substance Use Topics  . Alcohol use: No  . Drug use: No     Allergies   Darvocet [propoxyphene n-acetaminophen]   Review of Systems Review of Systems  Constitutional: Negative for appetite change and fever.  HENT: Positive for dental problem and ear pain. Negative for congestion, facial swelling, sore throat and trouble swallowing.   Eyes: Negative for pain and visual disturbance.  Musculoskeletal: Negative for neck pain and neck stiffness.  Neurological: Negative for dizziness, facial asymmetry and headaches.  Hematological: Negative for adenopathy.     Physical Exam Updated Vital Signs BP 120/80 (BP Location: Right Arm)   Pulse 72   Temp 98.2 F (36.8 C) (Oral)   Resp 16   Ht 5\' 4"  (1.626 m)   Wt 73.9 kg   SpO2 98%   BMI 27.98 kg/m   Physical Exam Vitals signs and nursing note reviewed.  Constitutional:      General: She is not in acute distress.    Appearance: She is well-developed.  HENT:     Head: Normocephalic and atraumatic.     Jaw: No trismus.     Right Ear: Tympanic membrane and ear canal normal.     Left Ear: Tympanic membrane and ear canal normal.     Ears:     Comments: Patient is wearing a hearing aid in the right ear    Nose: Nose normal.     Mouth/Throat:     Mouth: Mucous membranes are moist.     Dentition: Dental caries present. No dental abscesses.     Pharynx: Oropharynx is clear. Uvula midline. No uvula swelling.      Comments: Tenderness to palpation and dental caries of the left lower second molar.  No edema or fluctuance of the surrounding gums. no facial swelling Neck:     Musculoskeletal: Normal range of motion  and neck supple.  Cardiovascular:     Rate and Rhythm: Normal rate and regular rhythm.     Heart sounds: Normal heart sounds. No murmur.  Pulmonary:     Effort: Pulmonary effort is normal.     Breath sounds: Normal breath sounds.  Musculoskeletal: Normal range of motion.  Lymphadenopathy:     Cervical: No cervical adenopathy.  Skin:    General: Skin is warm and dry.  Neurological:     General: No focal deficit present.     Mental Status: She is alert.     Sensory: No sensory deficit.  Motor: No abnormal muscle tone.  Psychiatric:        Mood and Affect: Mood normal.      ED Treatments / Results  Labs (all labs ordered are listed, but only abnormal results are displayed) Labs Reviewed - No data to display  EKG None  Radiology No results found.  Procedures Procedures (including critical care time)  Medications Ordered in ED Medications - No data to display   Initial Impression / Assessment and Plan / ED Course  I have reviewed the triage vital signs and the nursing notes.  Pertinent labs & imaging results that were available during my care of the patient were reviewed by me and considered in my medical decision making (see chart for details).     Patient is well-appearing.  Nontoxic.  Vital signs reassuring.  Patient with tenderness of the left lower molar without evidence of abscess.  Airway is patent, no concerning symptoms for Ludewig's angina.  Patient given referral information for local dentistry and prescription for antibiotic.  She agrees to treatment plan.  Return precautions discussed.  Final Clinical Impressions(s) / ED Diagnoses   Final diagnoses:  Pain, dental    ED Discharge Orders    None       Pauline Ausriplett, Mesha Schamberger, PA-C 05/01/18 1757    Eber HongMiller, Brian, MD 05/02/18 1052

## 2018-05-01 NOTE — Discharge Instructions (Addendum)
Take the antibiotic as directed until it is finished.  You may continue taking the ibuprofen every 6 hours if needed.  Follow-up with a dentist soon.

## 2018-05-01 NOTE — ED Triage Notes (Signed)
Pt c/o of dental pain on left side of face x 3 days. Took OTC meds at home with no relief with swelling

## 2018-05-07 DIAGNOSIS — Z0001 Encounter for general adult medical examination with abnormal findings: Secondary | ICD-10-CM | POA: Diagnosis not present

## 2018-05-07 DIAGNOSIS — Z1389 Encounter for screening for other disorder: Secondary | ICD-10-CM | POA: Diagnosis not present

## 2018-05-07 DIAGNOSIS — K219 Gastro-esophageal reflux disease without esophagitis: Secondary | ICD-10-CM | POA: Diagnosis not present

## 2018-05-07 DIAGNOSIS — K047 Periapical abscess without sinus: Secondary | ICD-10-CM | POA: Diagnosis not present

## 2018-05-07 DIAGNOSIS — Z6829 Body mass index (BMI) 29.0-29.9, adult: Secondary | ICD-10-CM | POA: Diagnosis not present

## 2018-05-07 DIAGNOSIS — E119 Type 2 diabetes mellitus without complications: Secondary | ICD-10-CM | POA: Diagnosis not present

## 2018-05-07 DIAGNOSIS — I10 Essential (primary) hypertension: Secondary | ICD-10-CM | POA: Diagnosis not present

## 2018-05-07 DIAGNOSIS — B353 Tinea pedis: Secondary | ICD-10-CM | POA: Diagnosis not present

## 2018-05-17 ENCOUNTER — Ambulatory Visit: Payer: Medicare HMO | Admitting: Gastroenterology

## 2018-05-22 ENCOUNTER — Ambulatory Visit (INDEPENDENT_AMBULATORY_CARE_PROVIDER_SITE_OTHER): Payer: Medicare HMO | Admitting: Nurse Practitioner

## 2018-05-22 ENCOUNTER — Encounter: Payer: Self-pay | Admitting: Nurse Practitioner

## 2018-05-22 VITALS — BP 123/83 | HR 103 | Temp 97.5°F | Ht 64.0 in | Wt 166.4 lb

## 2018-05-22 DIAGNOSIS — K219 Gastro-esophageal reflux disease without esophagitis: Secondary | ICD-10-CM | POA: Diagnosis not present

## 2018-05-22 DIAGNOSIS — R1319 Other dysphagia: Secondary | ICD-10-CM

## 2018-05-22 DIAGNOSIS — R1310 Dysphagia, unspecified: Secondary | ICD-10-CM | POA: Diagnosis not present

## 2018-05-22 NOTE — Patient Instructions (Signed)
Your health issues we discussed today were:   Heartburn (GERD): 1. Continue taking Protonix once a day 2. Call us if your symptoms get worse  Swallowing difficulties (dysphagia): 1. I am glad this is better.  It is likely because your heartburn is better controlled 2. Call us if you have any recurrent swallowing issues  Overall I recommend:  1. Follow-up in 1 year 2. Call us if you have any questions or concerns.  At [facility] we value your feedback. You may receive a survey about your visit today. Please share your experience as we strive to create trusting relationships with our patients to provide genuine, compassionate, quality care.  We appreciate your understanding and patience as we review any laboratory studies, imaging, and other diagnostic tests that are ordered as we care for you. Our office policy is 5 business days for review of these results, and any emergent or urgent results are addressed in a timely manner for your best interest. If you do not hear from our office in 1 week, please contact us.   We also encourage the use of MyChart, which contains your medical information for your review as well. If you are not enrolled in this feature, an access code is on this after visit summary for your convenience. Thank you for allowing [place] to be involved in your care.  It was great to see you today!  I hope you have a great day!!

## 2018-05-22 NOTE — Assessment & Plan Note (Signed)
Dysphagia symptoms resolved with improvement in GERD.  Recommend she continue her PPI and follow-up in 1 year.

## 2018-05-22 NOTE — Progress Notes (Signed)
Referring Provider: Elfredia Nevins, MD Primary Care Physician:  Elfredia Nevins, MD Primary GI:  Dr. Darrick Penna  Chief Complaint  Patient presents with  . Gastroesophageal Reflux    doing ok    HPI:   Gina Osborne is a 58 y.o. female who presents for 33-month follow-up.  Patient was last seen by Dr. Darrick Penna in our office 11/15/2017 for GERD, dysphagia, colon cancer screening.  GERD flare in July 2019 resulting in coughing and vomiting.  Symptoms are resolved at his last visit.  Recommended avoid reflux triggers, continue Protonix, follow-up in 6 months.  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  Surgical pathology found the biopsies to be mild chronic gastritis without H. Pylori.  No recent GI imaging on file.  Today she states she's doing well. Has an abbscssed tooth and is on Amoxicillan. GERD symptoms remain resolved. No further dysphagia. Denies abdominal pain, N/V, hematochezia, melena, fever, chills, unintentional weight loss. Denies chest pain, dyspnea, dizziness, lightheadedness, syncope, near syncope. Denies any other upper or lower GI symptoms.  Past Medical History:  Diagnosis Date  . Migraine   . Thyroid disease     Past Surgical History:  Procedure Laterality Date  . ABDOMINAL HYSTERECTOMY    . ESOPHAGOGASTRODUODENOSCOPY N/A 10/31/2014   WLS:LHTDS HH/mild non-erosive gastritis/dysphagia due to stricture  . SAVORY DILATION N/A 10/31/2014   Procedure: SAVORY DILATION;  Surgeon: West Bali, MD;  Location: [facility];  Service: Endoscopy;  Laterality: N/A;    Current Outpatient Medications  Medication Sig Dispense Refill  . amoxicillin (AMOXIL) 250 MG capsule Take 250 mg by mouth 3 (three) times daily.    Marland Kitchen glimepiride (AMARYL) 2 MG tablet Take 6 mg by mouth daily with breakfast.    . levothyroxine (SYNTHROID, LEVOTHROID) 50 MCG tablet Take 50 mcg by mouth daily.      Marland Kitchen lisinopril (PRINIVIL,ZESTRIL) 2.5 MG tablet Take 2.5 mg by mouth daily.     Marland Kitchen loratadine (CLARITIN) 10 MG tablet Take 1 tablet by mouth daily.    . pantoprazole (PROTONIX) 40 MG tablet TAKE ONE TABLET BY MOUTH ONCE DAILY 30 MINUTES  PRIOR TO YOUR FIRST MEAL FOREVER (Patient taking differently: Take 40 mg by mouth daily. ) 30 tablet 5  . phentermine (ADIPEX-P) 37.5 MG tablet Take 37.5 mg by mouth daily before breakfast.     . topiramate (TOPAMAX) 100 MG tablet Take 100 mg by mouth at bedtime.    . traZODone (DESYREL) 50 MG tablet Take 50-150 mg by mouth at bedtime.      No current facility-administered medications for this visit.     Allergies as of 05/22/2018 - Review Complete 05/22/2018  Allergen Reaction Noted  . Darvocet [propoxyphene n-acetaminophen]  08/04/2011    Family History  Problem Relation Age of Onset  . Pancreatic cancer Father   . Colon cancer Neg Hx     Social History   Socioeconomic History  . Marital status: Divorced    Spouse name: Not on file  . Number of children: Not on file  . Years of education: Not on file  . Highest education level: Not on file  Occupational History  . Not on file  Social Needs  . Financial resource strain: Not on file  . Food insecurity:    Worry: Not on file    Inability: Not on file  . Transportation needs:    Medical: Not  on file    Non-medical: Not on file  Tobacco Use  . Smoking status: Never Smoker  . Smokeless tobacco: Never Used  Substance and Sexual Activity  . Alcohol use: No  . Drug use: No  . Sexual activity: Yes    Birth control/protection: None  Lifestyle  . Physical activity:    Days per week: Not on file    Minutes per session: Not on file  . Stress: Not on file  Relationships  . Social connections:    Talks on phone: Not on file    Gets together: Not on file    Attends religious service: Not on file    Active member of club or organization: Not on file    Attends meetings of clubs or  organizations: Not on file    Relationship status: Not on file  Other Topics Concern  . Not on file  Social History Narrative  . Not on file    Review of Systems: Complete ROS negative except as per HPI.   Physical Exam: BP 123/83   Pulse (!) 103   Temp (!) 97.5 F (36.4 C) (Oral)   Ht 5\' 4"  (1.626 m)   Wt 166 lb 6.4 oz (75.5 kg)   BMI 28.56 kg/m  General:   Alert and oriented. Pleasant and cooperative. Well-nourished and well-developed.  Eyes:  Without icterus, sclera clear and conjunctiva pink.  Cardiovascular:  S1, S2 present without murmurs appreciated. Extremities without clubbing or edema. Respiratory:  Clear to auscultation bilaterally. No wheezes, rales, or rhonchi. No distress.  Gastrointestinal:  +BS, soft, non-tender and non-distended. No HSM noted. No guarding or rebound. No masses appreciated.  Rectal:  Deferred  Musculoskalatal:  Symmetrical without gross deformities. Neurologic:  Alert and oriented x4;  grossly normal neurologically. Psych:  Alert and cooperative. Normal mood and affect. Heme/Lymph/Immune: No excessive bruising noted.    05/22/2018 3:37 PM   Disclaimer: This note was dictated with voice recognition software. Similar sounding words can inadvertently be transcribed and may not be corrected upon review.

## 2018-05-22 NOTE — Assessment & Plan Note (Signed)
GERD symptoms essentially resolved at this time.  Continue medications including PPI.  Follow-up in 1 year.

## 2018-05-23 NOTE — Progress Notes (Signed)
CC'D TO PCP °

## 2018-08-08 DIAGNOSIS — K219 Gastro-esophageal reflux disease without esophagitis: Secondary | ICD-10-CM | POA: Diagnosis not present

## 2018-08-08 DIAGNOSIS — I10 Essential (primary) hypertension: Secondary | ICD-10-CM | POA: Diagnosis not present

## 2018-08-08 DIAGNOSIS — E119 Type 2 diabetes mellitus without complications: Secondary | ICD-10-CM | POA: Diagnosis not present

## 2018-08-08 DIAGNOSIS — Z1389 Encounter for screening for other disorder: Secondary | ICD-10-CM | POA: Diagnosis not present

## 2018-08-08 DIAGNOSIS — Z6829 Body mass index (BMI) 29.0-29.9, adult: Secondary | ICD-10-CM | POA: Diagnosis not present

## 2018-08-08 DIAGNOSIS — E063 Autoimmune thyroiditis: Secondary | ICD-10-CM | POA: Diagnosis not present

## 2018-08-09 DIAGNOSIS — E663 Overweight: Secondary | ICD-10-CM | POA: Diagnosis not present

## 2018-08-09 DIAGNOSIS — E119 Type 2 diabetes mellitus without complications: Secondary | ICD-10-CM | POA: Diagnosis not present

## 2018-08-09 DIAGNOSIS — Z1389 Encounter for screening for other disorder: Secondary | ICD-10-CM | POA: Diagnosis not present

## 2018-08-09 DIAGNOSIS — Z6829 Body mass index (BMI) 29.0-29.9, adult: Secondary | ICD-10-CM | POA: Diagnosis not present

## 2018-08-09 DIAGNOSIS — Z13220 Encounter for screening for lipoid disorders: Secondary | ICD-10-CM | POA: Diagnosis not present

## 2018-09-04 DIAGNOSIS — H52229 Regular astigmatism, unspecified eye: Secondary | ICD-10-CM | POA: Diagnosis not present

## 2018-09-04 DIAGNOSIS — Z0100 Encounter for examination of eyes and vision without abnormal findings: Secondary | ICD-10-CM | POA: Diagnosis not present

## 2018-09-27 ENCOUNTER — Encounter (HOSPITAL_COMMUNITY): Payer: Self-pay | Admitting: Emergency Medicine

## 2018-09-27 ENCOUNTER — Other Ambulatory Visit: Payer: Self-pay

## 2018-09-27 ENCOUNTER — Emergency Department (HOSPITAL_COMMUNITY)
Admission: EM | Admit: 2018-09-27 | Discharge: 2018-09-27 | Disposition: A | Discharge status: Home / Self Care | Payer: Medicare HMO | Attending: Emergency Medicine | Admitting: Emergency Medicine

## 2018-09-27 DIAGNOSIS — K0889 Other specified disorders of teeth and supporting structures: Secondary | ICD-10-CM | POA: Insufficient documentation

## 2018-09-27 DIAGNOSIS — Z79899 Other long term (current) drug therapy: Secondary | ICD-10-CM | POA: Insufficient documentation

## 2018-09-27 MED ORDER — BUPIVACAINE HCL (PF) 0.5 % IJ SOLN
5.0000 mL | Freq: Once | INTRAMUSCULAR | Status: DC
  Filled 2018-09-27: qty 30

## 2018-09-27 MED ORDER — BUPIVACAINE-EPINEPHRINE (PF) 0.5% -1:200000 IJ SOLN
INTRAMUSCULAR | Status: AC
  Filled 2018-09-27: qty 1.8

## 2018-09-27 MED ORDER — CHLORHEXIDINE GLUCONATE 0.12% ORAL RINSE (MEDLINE KIT): 15.0000 mL | Freq: Two times a day (BID) | OROMUCOSAL | 0 refills | Status: DC

## 2018-09-27 NOTE — Discharge Instructions (Addendum)
Take antibiotics as directed. Please take all of your antibiotics until finished. ° °Swish and spit 15 mL of chlorhexidine mouthwash for 30 seconds times daily.  You can also gargle warm salt water up to 5 times daily.  Both of these rinses can help to remove bacteria from the mouth.  You can also use viscous lidocaine every 3 hours to help numb the area.  You can apply a cool compress for 15 to 20 minutes, but avoid applying heat to the area. ° °Apply warm compresses to jaw throughout the day. You can take Tylenol or Ibuprofen as directed for pain. You can alternate Tylenol and Ibuprofen every 4 hours. If you take Tylenol at 1pm, then you can take Ibuprofen at 5pm. Then you can take Tylenol again at 9pm.  ° °The exam and treatment you received today has been provided on an emergency basis only. This is not a substitute for complete medical or dental care. If your problem worsens or new symptoms (problems) appear, and you are unable to arrange prompt follow-up care with your dentist, call or return to this location. If you do not have a dentist, please follow-up with one on the list provided ° °CALL YOUR DENTIST OR RETURN IMMEDIATELY IF you develop a fever, rash, difficulty breathing or swallowing, neck or facial swelling, or other potentially serious concerns. ° ° ° °

## 2018-09-27 NOTE — ED Triage Notes (Signed)
Pt with L lower dental pain since yesterday. States she saw MD today and was started on PCN but that she "thinks it has made it worse and thinks she needs to be started on a antibiotic".

## 2018-09-27 NOTE — ED Provider Notes (Signed)
[facility] Provider Note   CSN: [id number] Arrival date & time: 09/27/18  1945    History   Chief Complaint Chief Complaint  Patient presents with  . Dental Pain    HPI MANROOP Osborne is a 58 y.o. female presents emergency department today with chief complaint of dental pain x2 days.  Pain is located on left lower jaw.  She states she broke a tooth recently.  She describes the pain as sharp and throbbing.  The pain is constant.  She rates pain 10 of 10 in severity.  She saw PCP today who started her on penicillin.  She states she has taken 2 doses of penicillin however the pain has gotten much worse. She has not taken anything for pain prior to arrival.  Denies fever, chills, voice change, inability to control secretions, nausea/vomiting, facial swelling, dysphagia, odynophagia, drainage or trauma     Past Medical History:  Diagnosis Date  . Migraine   . Thyroid disease     Patient Active Problem List   Diagnosis Date Noted  . GERD (gastroesophageal reflux disease) 12/17/2015  . Colon cancer screening 12/17/2015  . Gastritis 12/30/2014  . Dysphagia 09/29/2014    Past Surgical History:  Procedure Laterality Date  . ABDOMINAL HYSTERECTOMY    . ESOPHAGOGASTRODUODENOSCOPY N/A 10/31/2014   FEO:FHQRF HH/mild non-erosive gastritis/dysphagia due to stricture  . SAVORY DILATION N/A 10/31/2014   Procedure: SAVORY DILATION;  Surgeon: Danie Binder, MD;  Location: [facility];  Service: Endoscopy;  Laterality: N/A;     OB History   No obstetric history on file.      Home Medications    Prior to Admission medications   Medication Sig Start Date End Date Taking? Authorizing Provider  amoxicillin (AMOXIL) 250 MG capsule Take 250 mg by mouth 3 (three) times daily. 05/18/18   [provider]  chlorhexidine gluconate, MEDLINE KIT, (PERIDEX) 0.12 % solution Use as directed 15 mLs in the mouth or throat 2 (two) times daily. 09/27/18   ,  E,  PA-C  glimepiride (AMARYL) 2 MG tablet Take 6 mg by mouth daily with breakfast.    [provider]  levothyroxine (SYNTHROID, LEVOTHROID) 50 MCG tablet Take 50 mcg by mouth daily.     [provider]  lisinopril (PRINIVIL,ZESTRIL) 2.5 MG tablet Take 2.5 mg by mouth daily.  08/28/14   [provider]  loratadine (CLARITIN) 10 MG tablet Take 1 tablet by mouth daily. 05/04/18   [provider]  pantoprazole (PROTONIX) 40 MG tablet TAKE ONE TABLET BY MOUTH ONCE DAILY 30 MINUTES  PRIOR TO YOUR FIRST MEAL FOREVER Patient taking differently: Take 40 mg by mouth daily.  11/16/15   Carlis Stable, NP  phentermine (ADIPEX-P) 37.5 MG tablet Take 37.5 mg by mouth daily before breakfast.     [provider]  topiramate (TOPAMAX) 100 MG tablet Take 100 mg by mouth at bedtime.    [provider]  traZODone (DESYREL) 50 MG tablet Take 50-150 mg by mouth at bedtime.     [provider]    Family History Family History  Problem Relation Age of Onset  . Pancreatic cancer Father   . Colon cancer Neg Hx     Social History Social History   Tobacco Use  . Smoking status: Never Smoker  . Smokeless tobacco: Never Used  Substance Use Topics  . Alcohol use: No  . Drug use: No     Allergies   Darvocet [  propoxyphene n-acetaminophen]   Review of Systems Review of Systems  Constitutional: Negative for chills and fever.  HENT: Positive for dental problem. Negative for drooling, facial swelling, trouble swallowing and voice change.   Gastrointestinal: Negative for nausea and vomiting.     Physical Exam Updated Vital Signs BP (!) 160/97 (BP Location: Right Arm)   Pulse 90   Temp 97.7 F (36.5 C) (Oral)   Resp 16   Ht 5' 4"  (1.626 m)   Wt 70.3 kg   SpO2 100%   BMI 26.61 kg/m   Physical Exam Vitals signs and nursing note reviewed.  Constitutional:      Appearance: She is well-developed. She is not ill-appearing or toxic-appearing.   HENT:     Head: Normocephalic.     Ears:     Comments: Hearing aids in bilateral ears    Nose: Nose normal.     Mouth/Throat:      Comments: Handles oral secretions without difficulty. No swelling or tenderness to the submental or submandibular regions. No swelling or tenderness into the soft tissues of the neck.Full active range of motion of the jaw. Neck is supple with full active range of motion, no tenderness to palpation of the soft tissues.    Eyes:     General: No scleral icterus.       Right eye: No discharge.        Left eye: No discharge.     Conjunctiva/sclera: Conjunctivae normal.  Neck:     Musculoskeletal: Normal range of motion.     Vascular: No JVD.  Cardiovascular:     Rate and Rhythm: Normal rate and regular rhythm.     Pulses: Normal pulses.     Heart sounds: Normal heart sounds.  Pulmonary:     Effort: Pulmonary effort is normal.     Breath sounds: Normal breath sounds.  Abdominal:     General: There is no distension.  Musculoskeletal: Normal range of motion.  Skin:    General: Skin is warm and dry.  Neurological:     Mental Status: She is oriented to person, place, and time.     GCS: GCS eye subscore is 4. GCS verbal subscore is 5. GCS motor subscore is 6.     Comments: Fluent speech, no facial droop.  Psychiatric:        Mood and Affect: Affect is tearful.        Behavior: Behavior normal.      ED Treatments / Results  Labs (all labs ordered are listed, but only abnormal results are displayed) Labs Reviewed - No data to display  EKG None  Radiology No results found.  Procedures .1:46 PM Dental block was performed.1.48m 0.5% marcaine with epi and an inferior alveolar block was performed. Injections also made at base of tooth. Adequate anesthesia was obtained. Minimal bleeding after injections. Patient tolerated procedure well with no immediate complications.    Medications Ordered in ED Medications - No data to display   Initial  Impression / Assessment and Plan / ED Course  I have reviewed the triage vital signs and the nursing notes.  Pertinent labs & imaging results that were available during my care of the patient were reviewed by me and considered in my medical decision making (see chart for details).  Pt is well appearing.  Chronic dental decay. Patient with toothache.  No gross abscess.  Exam unconcerning for Ludwig's angina or spread of infection. Inferior alveolar dental block performed and was adequate anesthesia  achieved. Pt tolerated well. Will recommend pt continue penicillin and anti-inflammatories medicine.  Urged patient to follow-up with dentist.  VSS.  Pt appears stable for d/c  This note was prepared using Dragon voice recognition software and may include unintentional dictation errors due to the inherent limitations of voice recognition software.   Final Clinical Impressions(s) / ED Diagnoses   Final diagnoses:  Pain, dental    ED Discharge Orders         Ordered    chlorhexidine gluconate, MEDLINE KIT, (PERIDEX) 0.12 % solution  2 times daily     09/27/18 2127           Cherre Robins, PA-C 09/28/18 1347    Julianne Rice, MD 10/02/18 1650

## 2018-11-02 DIAGNOSIS — Z6829 Body mass index (BMI) 29.0-29.9, adult: Secondary | ICD-10-CM | POA: Diagnosis not present

## 2018-11-02 DIAGNOSIS — I10 Essential (primary) hypertension: Secondary | ICD-10-CM | POA: Diagnosis not present

## 2018-11-02 DIAGNOSIS — J45909 Unspecified asthma, uncomplicated: Secondary | ICD-10-CM | POA: Diagnosis not present

## 2018-11-02 DIAGNOSIS — K219 Gastro-esophageal reflux disease without esophagitis: Secondary | ICD-10-CM | POA: Diagnosis not present

## 2018-11-02 DIAGNOSIS — E119 Type 2 diabetes mellitus without complications: Secondary | ICD-10-CM | POA: Diagnosis not present

## 2019-02-04 DIAGNOSIS — B353 Tinea pedis: Secondary | ICD-10-CM | POA: Diagnosis not present

## 2019-02-04 DIAGNOSIS — I10 Essential (primary) hypertension: Secondary | ICD-10-CM | POA: Diagnosis not present

## 2019-02-04 DIAGNOSIS — Z6830 Body mass index (BMI) 30.0-30.9, adult: Secondary | ICD-10-CM | POA: Diagnosis not present

## 2019-02-04 DIAGNOSIS — B351 Tinea unguium: Secondary | ICD-10-CM | POA: Diagnosis not present

## 2019-02-04 DIAGNOSIS — E1165 Type 2 diabetes mellitus with hyperglycemia: Secondary | ICD-10-CM | POA: Diagnosis not present

## 2019-02-04 DIAGNOSIS — Z23 Encounter for immunization: Secondary | ICD-10-CM | POA: Diagnosis not present

## 2019-03-17 NOTE — Progress Notes (Signed)
REVIEWED-NO ADDITIONAL RECOMMENDATIONS. 

## 2019-05-08 DIAGNOSIS — E119 Type 2 diabetes mellitus without complications: Secondary | ICD-10-CM | POA: Diagnosis not present

## 2019-05-08 DIAGNOSIS — E063 Autoimmune thyroiditis: Secondary | ICD-10-CM | POA: Diagnosis not present

## 2019-05-08 DIAGNOSIS — Z6829 Body mass index (BMI) 29.0-29.9, adult: Secondary | ICD-10-CM | POA: Diagnosis not present

## 2019-05-08 DIAGNOSIS — Z1389 Encounter for screening for other disorder: Secondary | ICD-10-CM | POA: Diagnosis not present

## 2019-05-08 DIAGNOSIS — G43909 Migraine, unspecified, not intractable, without status migrainosus: Secondary | ICD-10-CM | POA: Diagnosis not present

## 2019-05-08 DIAGNOSIS — K219 Gastro-esophageal reflux disease without esophagitis: Secondary | ICD-10-CM | POA: Diagnosis not present

## 2019-05-08 DIAGNOSIS — Z0001 Encounter for general adult medical examination with abnormal findings: Secondary | ICD-10-CM | POA: Diagnosis not present

## 2019-05-08 DIAGNOSIS — E663 Overweight: Secondary | ICD-10-CM | POA: Diagnosis not present

## 2019-05-08 DIAGNOSIS — J45909 Unspecified asthma, uncomplicated: Secondary | ICD-10-CM | POA: Diagnosis not present

## 2019-05-08 DIAGNOSIS — I10 Essential (primary) hypertension: Secondary | ICD-10-CM | POA: Diagnosis not present

## 2019-05-09 DIAGNOSIS — Z0001 Encounter for general adult medical examination with abnormal findings: Secondary | ICD-10-CM | POA: Diagnosis not present

## 2019-05-09 DIAGNOSIS — Z1389 Encounter for screening for other disorder: Secondary | ICD-10-CM | POA: Diagnosis not present

## 2019-05-09 DIAGNOSIS — E119 Type 2 diabetes mellitus without complications: Secondary | ICD-10-CM | POA: Diagnosis not present

## 2019-05-09 DIAGNOSIS — E663 Overweight: Secondary | ICD-10-CM | POA: Diagnosis not present

## 2019-05-14 ENCOUNTER — Encounter: Payer: Self-pay | Admitting: Gastroenterology

## 2019-06-07 DIAGNOSIS — J301 Allergic rhinitis due to pollen: Secondary | ICD-10-CM | POA: Diagnosis not present

## 2019-06-07 DIAGNOSIS — J3089 Other allergic rhinitis: Secondary | ICD-10-CM | POA: Diagnosis not present

## 2019-06-07 DIAGNOSIS — L501 Idiopathic urticaria: Secondary | ICD-10-CM | POA: Diagnosis not present

## 2019-06-07 DIAGNOSIS — Z91018 Allergy to other foods: Secondary | ICD-10-CM | POA: Diagnosis not present

## 2019-06-19 ENCOUNTER — Ambulatory Visit (INDEPENDENT_AMBULATORY_CARE_PROVIDER_SITE_OTHER): Payer: Medicare HMO | Admitting: Gastroenterology

## 2019-06-19 ENCOUNTER — Other Ambulatory Visit: Payer: Self-pay

## 2019-06-19 ENCOUNTER — Encounter: Payer: Self-pay | Admitting: Gastroenterology

## 2019-06-19 DIAGNOSIS — K219 Gastro-esophageal reflux disease without esophagitis: Secondary | ICD-10-CM | POA: Diagnosis not present

## 2019-06-19 DIAGNOSIS — Z1211 Encounter for screening for malignant neoplasm of colon: Secondary | ICD-10-CM

## 2019-06-19 NOTE — Assessment & Plan Note (Signed)
AVERAGE RISK.  CALL ME TO SCHEDULE COLONOSCOPY. FOLLOW UP IN THE OFFICE WILL BE SCHEDULED IF NEEDED AFTER ENDOSCOPY.

## 2019-06-19 NOTE — Patient Instructions (Signed)
CONTINUE YOUR WEIGHT LOSS EFFORTS.  AVOID REFLUX TRIGGERS. SEE INFO BELOW.  TO CONTROL HEARTBURN/REFLUX, CONTINUE PROTONIX. TAKE 30 MINUTES PRIOR TO BREAKFAST.  CALL ME TO SCHEDULE COLONOSCOPY.  FOLLOW UP IN THE OFFICE WILL BE SCHEDULED IF NEEDED AFTER ENDOSCOPY.    Lifestyle and home remedies TO CONTROL HEARTBURN You may eliminate or reduce the frequency of heartburn by making the following lifestyle changes:  . Control your weight. Being overweight is a major risk factor for heartburn and GERD. Excess pounds put pressure on your abdomen, pushing up your stomach and causing acid to back up into your esophagus.   . Eat smaller meals. 4 TO 6 MEALS A DAY. This reduces pressure on the lower esophageal sphincter, helping to prevent the valve from opening and acid from washing back into your esophagus.   Allena Earing your belt. Clothes that fit tightly around your waist put pressure on your abdomen and the lower esophageal sphincter.   . Eliminate heartburn triggers. Everyone has specific triggers. Common triggers such as fatty or fried foods, spicy food, tomato sauce, carbonated beverages, alcohol, chocolate, mint, garlic, onion, caffeine and nicotine may make heartburn worse.   Marland Kitchen Avoid stooping or bending. Tying your shoes is OK. Bending over for longer periods to weed your garden isn't, especially soon after eating.   . Don't lie down after a meal. Wait at least three to four hours after eating before going to bed, and don't lie down right after eating.   Alternative medicine . Several home remedies exist for treating GERD, but they provide only temporary relief. They include drinking baking soda (sodium bicarbonate) added to water or drinking other fluids such as baking soda mixed with cream of tartar and water. . Although these liquids create temporary relief by neutralizing, washing away or buffering acids, eventually they aggravate the situation by adding gas and fluid to your stomach,  increasing pressure and causing more acid reflux. Further, adding more sodium to your diet may increase your blood pressure and add stress to your heart, and excessive bicarbonate ingestion can alter the acid-base balance in your body.

## 2019-06-19 NOTE — Progress Notes (Signed)
Subjective:    Patient ID: Gina Osborne, female    DOB: June 26, 1960, 59 y.o.   MRN: [id number]  [employer]mond [employer], MD  HPI Heartburn fairly well controlled. Sugar pills changed. BMs: EVERY 2 DAYS.  PT DENIES FEVER, CHILLS, HEMATOCHEZIA, HEMATEMESIS, nausea, vomiting, melena, diarrhea, CHEST PAIN, SHORTNESS OF BREATH,  CHANGE IN BOWEL IN HABITS, constipation, abdominal pain, problems swallowing, OR heartburn or indigestion.  Past Medical History:  Diagnosis Date  . Migraine   . Thyroid disease    Past Surgical History:  Procedure Laterality Date  . ABDOMINAL HYSTERECTOMY    . ESOPHAGOGASTRODUODENOSCOPY N/A 10/31/2014   CHE:NIDPO HH/mild non-erosive gastritis/dysphagia due to stricture  . SAVORY DILATION N/A 10/31/2014   Procedure: SAVORY DILATION;  Surgeon: Danie Binder, MD;  Location: [facility];  Service: Endoscopy;  Laterality: N/A;   Allergies  Allergen Reactions  . Darvocet [Propoxyphene N-Acetaminophen]     Current Outpatient Medications  Medication Sig    . atorvastatin (LIPITOR) 10 MG tablet Take 1 tablet by mouth daily.    . Flaxseed, Linseed, (FLAXSEED OIL) 1000 MG CAPS Take by mouth 2 (two) times daily.    Marland Kitchen glimepiride (AMARYL) 4 MG tablet Take 1 tablet by mouth 2 (two) times daily.     Marland Kitchen levothyroxine (SYNTHROID, LEVOTHROID) 50 MCG tablet Take 50 mcg by mouth daily.     Marland Kitchen lisinopril (PRINIVIL,ZESTRIL) 2.5 MG tablet Take 2.5 mg by mouth daily.     Marland Kitchen loratadine (CLARITIN) 10 MG tablet Take 1 tablet by mouth daily.    . pantoprazole (PROTONIX) 40 MG tablet  Take 40 mg by mouth daily. )    . phentermine (ADIPEX-P) 37.5 MG tablet Take 37.5 mg by mouth daily before breakfast.     . topiramate (TOPAMAX) 100 MG tablet Take 100 mg by mouth at bedtime.    . traZODone (DESYREL) 50 MG tablet Take 150 mg by mouth at bedtime.     .      .      .       Review of Systems PER HPI OTHERWISE ALL SYSTEMS ARE NEGATIVE.     Objective:   Physical Exam Constitutional:    General: She is not in acute distress.    Appearance: Normal appearance.  HENT:     Mouth/Throat:     Comments: MASK IN PLACE Eyes:     General: No scleral icterus.    Pupils: Pupils are equal, round, and reactive to light.  Cardiovascular:     Rate and Rhythm: Normal rate and regular rhythm.     Pulses: Normal pulses.     Heart sounds: Normal heart sounds.  Pulmonary:     Effort: Pulmonary effort is normal.     Breath sounds: Normal breath sounds.  Abdominal:     General: Bowel sounds are normal.     Palpations: Abdomen is soft.     Tenderness: There is no abdominal tenderness.  Musculoskeletal:     Cervical back: Normal range of motion.     Right lower leg: No edema.     Left lower leg: No edema.  Lymphadenopathy:     Cervical: No cervical adenopathy.  Skin:    General: Skin is warm and dry.  Neurological:     Mental Status: She is alert and oriented to person, place, and time.     Comments: NO  NEW FOCAL DEFICITS  Psychiatric:        Mood and Affect: Mood normal.  Comments: NORMAL AFFECT       Assessment & Plan:

## 2019-06-19 NOTE — Assessment & Plan Note (Signed)
SYMPTOMS FAIRLY WELL CONTROLLED.  CONTINUE YOUR WEIGHT LOSS EFFORTS. AVOID REFLUX TRIGGERS.  HANDOUT GIVEN. TO CONTROL HEARTBURN/REFLUX, CONTINUE PROTONIX. TAKE 30 MINUTES PRIOR TO BREAKFAST. FOLLOW UP IN THE OFFICE WILL BE SCHEDULED IF NEEDED AFTER ENDOSCOPY.

## 2019-06-20 ENCOUNTER — Telehealth: Payer: Self-pay | Admitting: Gastroenterology

## 2019-06-20 NOTE — Telephone Encounter (Signed)
PA for TCS approved via [employer]. Auth# [id number] dates 08/16/2019-09/15/2019

## 2019-06-20 NOTE — Telephone Encounter (Signed)
Pt saw SF yesterday and called today to let us know she wants to schedule her procedure and is asking for April 6,7, or 8th so her daughter can bring her and she needs to know something today so her daughter can put in time at her work. Please call [phone]

## 2019-06-20 NOTE — Telephone Encounter (Signed)
Called pt. Gave her dates available. She will call back

## 2019-06-20 NOTE — Telephone Encounter (Signed)
Patient called back. We have scheduled procedure for 5/7 at 1:00pm, arrival 12:00pm. Patient also scheduled for covid testing 5/4 at 3:00pm. Patient aware of location. Patient also aware will mail prep instructions with her covid test appt. Confirmed mailing address.

## 2019-06-20 NOTE — Telephone Encounter (Signed)
See prior note open  

## 2019-06-20 NOTE — Telephone Encounter (Signed)
fowarding to Dr. Darrick Penna as we don't have orders.

## 2019-06-20 NOTE — Telephone Encounter (Signed)
NEEDS SCREENING TCS. HOLD AMARYL ON DAY OF TCS. NEEDS PHENRGAN 12.5 MG IV IN PREOP.

## 2019-06-20 NOTE — Telephone Encounter (Signed)
Pt returning call. [phone]

## 2019-08-13 ENCOUNTER — Encounter (HOSPITAL_COMMUNITY)
Admission: RE | Admit: 2019-08-13 | Discharge: 2019-08-13 | Disposition: A | Discharge status: Home / Self Care | Payer: Medicare HMO | Source: Ambulatory Visit | Attending: Gastroenterology | Admitting: Gastroenterology

## 2019-08-13 ENCOUNTER — Other Ambulatory Visit: Payer: Self-pay

## 2019-08-13 DIAGNOSIS — Z6829 Body mass index (BMI) 29.0-29.9, adult: Secondary | ICD-10-CM | POA: Diagnosis not present

## 2019-08-13 DIAGNOSIS — Z20822 Contact with and (suspected) exposure to covid-19: Secondary | ICD-10-CM | POA: Insufficient documentation

## 2019-08-13 DIAGNOSIS — K219 Gastro-esophageal reflux disease without esophagitis: Secondary | ICD-10-CM | POA: Diagnosis not present

## 2019-08-13 DIAGNOSIS — Z01812 Encounter for preprocedural laboratory examination: Secondary | ICD-10-CM | POA: Diagnosis not present

## 2019-08-13 DIAGNOSIS — E063 Autoimmune thyroiditis: Secondary | ICD-10-CM | POA: Diagnosis not present

## 2019-08-13 DIAGNOSIS — G43909 Migraine, unspecified, not intractable, without status migrainosus: Secondary | ICD-10-CM | POA: Diagnosis not present

## 2019-08-13 DIAGNOSIS — E1165 Type 2 diabetes mellitus with hyperglycemia: Secondary | ICD-10-CM | POA: Diagnosis not present

## 2019-08-13 DIAGNOSIS — E663 Overweight: Secondary | ICD-10-CM | POA: Diagnosis not present

## 2019-08-14 LAB — SARS CORONAVIRUS 2 (TAT 6-24 HRS): SARS Coronavirus 2: NEGATIVE

## 2019-08-16 ENCOUNTER — Ambulatory Visit (HOSPITAL_COMMUNITY)
Admission: RE | Admit: 2019-08-16 | Discharge: 2019-08-16 | Disposition: A | Discharge status: Home Health | Payer: Medicare HMO | Attending: Gastroenterology | Admitting: Gastroenterology

## 2019-08-16 ENCOUNTER — Encounter (HOSPITAL_COMMUNITY): Payer: Self-pay | Admitting: Gastroenterology

## 2019-08-16 ENCOUNTER — Encounter (HOSPITAL_COMMUNITY)
Admission: RE | Disposition: A | Discharge status: Home Health | Payer: Self-pay | Source: Home / Self Care | Attending: Gastroenterology

## 2019-08-16 ENCOUNTER — Other Ambulatory Visit: Payer: Self-pay

## 2019-08-16 DIAGNOSIS — K5730 Diverticulosis of large intestine without perforation or abscess without bleeding: Secondary | ICD-10-CM | POA: Diagnosis not present

## 2019-08-16 DIAGNOSIS — G43909 Migraine, unspecified, not intractable, without status migrainosus: Secondary | ICD-10-CM | POA: Insufficient documentation

## 2019-08-16 DIAGNOSIS — I10 Essential (primary) hypertension: Secondary | ICD-10-CM | POA: Insufficient documentation

## 2019-08-16 DIAGNOSIS — Z7984 Long term (current) use of oral hypoglycemic drugs: Secondary | ICD-10-CM | POA: Diagnosis not present

## 2019-08-16 DIAGNOSIS — Z79890 Hormone replacement therapy (postmenopausal): Secondary | ICD-10-CM | POA: Diagnosis not present

## 2019-08-16 DIAGNOSIS — Z885 Allergy status to narcotic agent status: Secondary | ICD-10-CM | POA: Insufficient documentation

## 2019-08-16 DIAGNOSIS — Z79899 Other long term (current) drug therapy: Secondary | ICD-10-CM | POA: Insufficient documentation

## 2019-08-16 DIAGNOSIS — E119 Type 2 diabetes mellitus without complications: Secondary | ICD-10-CM | POA: Diagnosis not present

## 2019-08-16 DIAGNOSIS — Z1211 Encounter for screening for malignant neoplasm of colon: Secondary | ICD-10-CM

## 2019-08-16 DIAGNOSIS — Z87891 Personal history of nicotine dependence: Secondary | ICD-10-CM | POA: Diagnosis not present

## 2019-08-16 DIAGNOSIS — K644 Residual hemorrhoidal skin tags: Secondary | ICD-10-CM | POA: Insufficient documentation

## 2019-08-16 DIAGNOSIS — E079 Disorder of thyroid, unspecified: Secondary | ICD-10-CM | POA: Insufficient documentation

## 2019-08-16 DIAGNOSIS — K648 Other hemorrhoids: Secondary | ICD-10-CM | POA: Diagnosis not present

## 2019-08-16 HISTORY — DX: Essential (primary) hypertension: I10

## 2019-08-16 HISTORY — DX: Type 2 diabetes mellitus without complications: E11.9

## 2019-08-16 HISTORY — PX: COLONOSCOPY: SHX5424

## 2019-08-16 LAB — GLUCOSE, CAPILLARY: Glucose-Capillary: 285 mg/dL — ABNORMAL HIGH (ref 70–99)

## 2019-08-16 SURGERY — COLONOSCOPY
Anesthesia: Moderate Sedation

## 2019-08-16 MED ORDER — MIDAZOLAM HCL 5 MG/5ML IJ SOLN
INTRAMUSCULAR | Status: AC
  Filled 2019-08-16: qty 10

## 2019-08-16 MED ORDER — PROMETHAZINE HCL 25 MG/ML IJ SOLN
12.5000 mg | Freq: Once | INTRAMUSCULAR | Status: AC
  Administered 2019-08-16: 12.5 mg via INTRAVENOUS

## 2019-08-16 MED ORDER — STERILE WATER FOR IRRIGATION IR SOLN
Status: DC | PRN
  Administered 2019-08-16: 1.5 mL

## 2019-08-16 MED ORDER — MIDAZOLAM HCL 5 MG/5ML IJ SOLN
INTRAMUSCULAR | Status: DC | PRN
  Administered 2019-08-16: 2 mg via INTRAVENOUS
  Administered 2019-08-16 (×2): 1 mg via INTRAVENOUS

## 2019-08-16 MED ORDER — MEPERIDINE HCL 100 MG/ML IJ SOLN
INTRAMUSCULAR | Status: DC | PRN
  Administered 2019-08-16: 25 mg via INTRAVENOUS

## 2019-08-16 MED ORDER — MEPERIDINE HCL 100 MG/ML IJ SOLN
INTRAMUSCULAR | Status: AC
  Filled 2019-08-16: qty 2

## 2019-08-16 MED ORDER — SODIUM CHLORIDE 0.9 % IV SOLN
INTRAVENOUS | Status: DC
  Administered 2019-08-16: 13:00:00 1000 mL via INTRAVENOUS

## 2019-08-16 MED ORDER — SODIUM CHLORIDE FLUSH 0.9 % IV SOLN
INTRAVENOUS | Status: AC
  Filled 2019-08-16: qty 10

## 2019-08-16 MED ORDER — PROMETHAZINE HCL 25 MG/ML IJ SOLN
INTRAMUSCULAR | Status: AC
  Filled 2019-08-16: qty 1

## 2019-08-16 NOTE — H&P (Signed)
Primary Care Physician:  Elfredia Nevins, MD Primary Gastroenterologist:  Dr. Darrick Penna  Pre-Procedure History & Physical: HPI:  Gina Osborne is a 59 y.o. female here for COLON CANCER SCREENING.  Past Medical History:  Diagnosis Date  . Diabetes mellitus without complication (HCC)   . Hypertension   . Migraine   . Thyroid disease     Past Surgical History:  Procedure Laterality Date  . ABDOMINAL HYSTERECTOMY    . CHOLECYSTECTOMY    . ESOPHAGOGASTRODUODENOSCOPY N/A 10/31/2014   OHY:WVPXT HH/mild non-erosive gastritis/dysphagia due to stricture  . SAVORY DILATION N/A 10/31/2014   Procedure: SAVORY DILATION;  Surgeon: West Bali, MD;  Location: [facility];  Service: Endoscopy;  Laterality: N/A;    Prior to Admission medications   Medication Sig Start Date End Date Taking? Authorizing Provider  atorvastatin (LIPITOR) 10 MG tablet Take 1 tablet by mouth daily. 04/22/19  Yes [provider]  Flaxseed, Linseed, (FLAXSEED OIL) 1000 MG CAPS Take by mouth 2 (two) times daily.   Yes [provider]  glimepiride (AMARYL) 2 MG tablet Take 6 mg by mouth daily with breakfast.   Yes [provider]  glimepiride (AMARYL) 4 MG tablet Take 1 tablet by mouth 2 (two) times daily.  05/28/19  Yes [provider]  levothyroxine (SYNTHROID, LEVOTHROID) 50 MCG tablet Take 50 mcg by mouth daily.    Yes [provider]  lisinopril (PRINIVIL,ZESTRIL) 2.5 MG tablet Take 2.5 mg by mouth daily.  08/28/14  Yes [provider]  loratadine (CLARITIN) 10 MG tablet Take 1 tablet by mouth daily. 05/04/18  Yes [provider]  pantoprazole (PROTONIX) 40 MG tablet TAKE ONE TABLET BY MOUTH ONCE DAILY 30 MINUTES  PRIOR TO YOUR FIRST MEAL FOREVER Patient taking differently: Take 40 mg by mouth daily.  11/16/15  Yes Anice Paganini, NP  phentermine (ADIPEX-P) 37.5 MG tablet Take 37.5 mg by mouth daily before breakfast.    Yes [provider]  topiramate  (TOPAMAX) 100 MG tablet Take 100 mg by mouth at bedtime.   Yes [provider]  traZODone (DESYREL) 50 MG tablet Take 150 mg by mouth at bedtime.    Yes [provider]    Allergies as of 06/20/2019 - Review Complete 06/19/2019  Allergen Reaction Noted  . Darvocet [propoxyphene n-acetaminophen]  08/04/2011    Family History  Problem Relation Age of Onset  . Pancreatic cancer Father   . Colon cancer Neg Hx     Social History   Socioeconomic History  . Marital status: Divorced    Spouse name: Not on file  . Number of children: Not on file  . Years of education: Not on file  . Highest education level: Not on file  Occupational History  . Not on file  Tobacco Use  . Smoking status: Former Games developer  . Smokeless tobacco: Never Used  . Tobacco comment: quit 2004  Substance and Sexual Activity  . Alcohol use: No  . Drug use: No  . Sexual activity: Yes    Birth control/protection: None  Other Topics Concern  . Not on file  Social History Narrative  . Not on file   Social Determinants of Health   Financial Resource Strain:   . Difficulty of Paying Living Expenses:   Food Insecurity:   . Worried About Programme researcher, broadcasting/film/video in the Last Year:   . [employer] of Food in the Last Year:   Transportation Needs:   . Lack of  Transportation (Medical):   Marland Kitchen Lack of Transportation (Non-Medical):   Physical Activity:   . Days of Exercise per Week:   . Minutes of Exercise per Session:   Stress:   . Feeling of Stress :   Social Connections:   . Frequency of Communication with Friends and Family:   . Frequency of Social Gatherings with Friends and Family:   . Attends Religious Services:   . Active Member of Clubs or Organizations:   . Attends Archivist Meetings:   Marland Kitchen Marital Status:   Intimate Partner Violence:   . Fear of Current or Ex-Partner:   . Emotionally Abused:   Marland Kitchen Physically Abused:   . Sexually Abused:     Review of Systems: See HPI, otherwise  negative ROS   Physical Exam: BP 104/79   Pulse 92   Temp 97.7 F (36.5 C) (Oral)   Resp 15   Ht 5\' 4"  (1.626 m)   Wt 72.1 kg   SpO2 100%   BMI 27.29 kg/m  General:   Alert,  pleasant and cooperative in NAD Head:  Normocephalic and atraumatic. Neck:  Supple; Lungs:  Clear throughout to auscultation.    Heart:  Regular rate and rhythm. Abdomen:  Soft, nontender and nondistended. Normal bowel sounds, without guarding, and without rebound.   Neurologic:  Alert and  oriented x4;  grossly normal neurologically.  Impression/Plan:    SCREENING  Plan:  1. TCS TODAY DISCUSSED PROCEDURE, BENEFITS, & RISKS: < 1% chance of medication reaction, bleeding, perforation, ASPIRATION, or rupture of spleen/liver requiring surgery to fix it and missed polyps < 1 cm 10-20% of the time.

## 2019-08-16 NOTE — Op Note (Signed)
[facility] Patient Name: Gina Osborne Procedure Date: 08/16/2019 12:42 PM MRN: [id number] Date of Birth: [birth year] Attending MD: Jonette Eva MD, MD CSN: [id number] Age: 59 Admit Type: Outpatient Procedure:                Colonoscopy, SCREENING Indications:              Screening for colorectal malignant neoplasm Providers:                Jonette Eva MD, MD, Buel Ream. Thomasena Edis RN, RN,                            Dyann Ruddle Referring MD:             Smith Robert Medicines:                Meperidine 25 mg IV, Midazolam 4 mg IV,                            Promethazine 12.5 mg IV Complications:            No immediate complications. Estimated Blood Loss:     Estimated blood loss: none. Procedure:                Pre-Anesthesia Assessment:                           - Prior to the procedure, a History and Physical                            was performed, and patient medications and                            allergies were reviewed. The patient's tolerance of                            previous anesthesia was also reviewed. The risks                            and benefits of the procedure and the sedation                            options and risks were discussed with the patient.                            All questions were answered, and informed consent                            was obtained. Prior Anticoagulants: The patient has                            taken no previous anticoagulant or antiplatelet                            agents. ASA Grade Assessment: II - A patient with  mild systemic disease. After reviewing the risks                            and benefits, the patient was deemed in                            satisfactory condition to undergo the procedure.                            After obtaining informed consent, the colonoscope                            was passed under direct vision. Throughout the                            procedure,  the patient's blood pressure, pulse, and                            oxygen saturations were monitored continuously. The                            [id number]-[id number] ([id number]) scope was introduced through                            the anus and advanced to the the cecum, identified                            by appendiceal orifice and ileocecal valve. The                            colonoscopy was technically difficult and complex                            due to restricted mobility of the colon, a tortuous                            colon and the patient's agitation. Successful                            completion of the procedure was aided by increasing                            the dose of sedation medication, changing                            endoscopes, straightening and shortening the scope                            to obtain bowel loop reduction and COLOWRAP. The                            patient tolerated the procedure fairly well. The  quality of the bowel preparation was good. The                            ileocecal valve, appendiceal orifice, and rectum                            were photographed. Scope In: 1:19:59 PM Scope Out: 1:38:55 PM Scope Withdrawal Time: 0 hours 16 minutes 39 seconds  Total Procedure Duration: 0 hours 18 minutes 56 seconds  Findings:      The recto-sigmoid colon was grossly tortuous. Advancing the scope       required changing endoscopes.      Multiple small and large-mouthed diverticula were found in the       recto-sigmoid colon, sigmoid colon and descending colon.      External and internal hemorrhoids were found. Impression:               - Tortuous colon.                           - Diverticulosis in the recto-sigmoid colon, in the                            sigmoid colon and in the descending colon.                           - External and internal hemorrhoids. Moderate Sedation:      Moderate (conscious) sedation was  administered by the endoscopy nurse       and supervised by the endoscopist. The following parameters were       monitored: oxygen saturation, heart rate, blood pressure, and response       to care. Total physician intraservice time was 33 minutes. Recommendation:           - Patient has a contact number available for                            emergencies. The signs and symptoms of potential                            delayed complications were discussed with the                            patient. Return to normal activities tomorrow.                            Written discharge instructions were provided to the                            patient.                           - High fiber diet.                           - Continue present medications.                           -  Repeat colonoscopy in 10 years for surveillance                            WITH PROPOFOL AND AN ULTRASLIM COLONOSCOPE. Procedure Code(s):        --- Professional ---                           [id number], Colonoscopy, flexible; diagnostic, including                            collection of specimen(s) by brushing or washing,                            when performed (separate procedure)                           99153, Moderate sedation; each additional 15                            minutes intraservice time                           G0500, Moderate sedation services provided by the                            same physician or other qualified health care                            professional performing a gastrointestinal                            endoscopic service that sedation supports,                            requiring the presence of an independent trained                            observer to assist in the monitoring of the                            patient's level of consciousness and physiological                            status; initial 15 minutes of intra-service time;                             patient age 22 years or older (additional time may                            be reported with [id number], as appropriate) Diagnosis Code(s):        --- Professional ---                           Z12.11, Encounter for screening for malignant  neoplasm of colon                           K64.8, Other hemorrhoids                           K57.30, Diverticulosis of large intestine without                            perforation or abscess without bleeding                           Q43.8, Other specified congenital malformations of                            intestine CPT copyright 2019 American Medical Association. All rights reserved. The codes documented in this report are preliminary and upon coder review may  be revised to meet current compliance requirements. Jonette Eva, MD Jonette Eva MD, MD 08/16/2019 3:19:29 PM This report has been signed electronically. Number of Addenda: 0

## 2019-08-16 NOTE — Discharge Instructions (Signed)
You have small internal hemorrhoids and diverticulosis IN YOUR LEFT COLON.  You have A FIXED ANGULATED RECTO-SIGMOID COLON DUE TO YOUR DIVERTICULOSIS. I HAD TO CHANGE TO AN ULTRASLIM COLON SCOPE.   DRINK WATER TO KEEP YOUR URINE LIGHT YELLOW.  FOLLOW A HIGH FIBER DIET. AVOID ITEMS THAT CAUSE BLOATING. See info below.   USE PREPARATION H FOUR TIMES  A DAY IF NEEDED TO RELIEVE RECTAL PAIN/PRESSURE/BLEEDING.  Next colonoscopy in 10 years.  Colonoscopy Care After Read the instructions outlined below and refer to this sheet in the next week. These discharge instructions provide you with general information on caring for yourself after you leave the hospital. While your treatment has been planned according to the most current medical practices available, unavoidable complications occasionally occur. If you have any problems or questions after discharge, call DR. Lakiya Cottam, [phone].  ACTIVITY  You may resume your regular activity, but move at a slower pace for the next 24 hours.   Take frequent rest periods for the next 24 hours.   Walking will help get rid of the air and reduce the bloated feeling in your belly (abdomen).   No driving for 24 hours (because of the medicine (anesthesia) used during the test).   You may shower.   Do not sign any important legal documents or operate any machinery for 24 hours (because of the anesthesia used during the test).    NUTRITION  Drink plenty of fluids.   You may resume your normal diet as instructed by your doctor.   Begin with a light meal and progress to your normal diet. Heavy or fried foods are harder to digest and may make you feel sick to your stomach (nauseated).   Avoid alcoholic beverages for 24 hours or as instructed.    MEDICATIONS  You may resume your normal medications.   WHAT YOU CAN EXPECT TODAY  Some feelings of bloating in the abdomen.   Passage of more gas than usual.   Spotting of blood in your stool or on the  toilet paper  .  IF YOU HAD POLYPS REMOVED DURING THE COLONOSCOPY:  Eat a soft diet IF YOU HAVE NAUSEA, BLOATING, ABDOMINAL PAIN, OR VOMITING.    FINDING OUT THE RESULTS OF YOUR TEST Not all test results are available during your visit. DR. Darrick Penna WILL CALL YOU WITHIN 14 DAYS OF YOUR PROCEDUE WITH YOUR RESULTS. Do not assume everything is normal if you have not heard from DR. Niyati Heinke, CALL HER OFFICE AT [phone].  SEEK IMMEDIATE MEDICAL ATTENTION AND CALL THE OFFICE: [phone] IF:  You have more than a spotting of blood in your stool.   Your belly is swollen (abdominal distention).   You are nauseated or vomiting.   You have a temperature over 101F.   You have abdominal pain or discomfort that is severe or gets worse throughout the day.   High-Fiber Diet A high-fiber diet changes your normal diet to include more whole grains, legumes, fruits, and vegetables. Changes in the diet involve replacing refined carbohydrates with unrefined foods. The calorie level of the diet is essentially unchanged. The Dietary Reference Intake (recommended amount) for adult males is 38 grams per day. For adult females, it is 25 grams per day. Pregnant and lactating women should consume 28 grams of fiber per day. Fiber is the intact part of a plant that is not broken down during digestion. Functional fiber is fiber that has been isolated from the plant to provide a beneficial effect in the body.  PURPOSE  Increase stool bulk.   Ease and regulate bowel movements.   Lower cholesterol.   REDUCE RISK OF COLON CANCER  INDICATIONS THAT YOU NEED MORE FIBER  Constipation and hemorrhoids.   Uncomplicated diverticulosis (intestine condition) and irritable bowel syndrome.   Weight management.   As a protective measure against hardening of the arteries (atherosclerosis), diabetes, and cancer.   GUIDELINES FOR INCREASING FIBER IN THE DIET  Start adding fiber to the diet slowly. A gradual increase  of about 5 more grams (2 servings of most fruits or vegetables) per day is best. Too rapid an increase in fiber may result in constipation, flatulence, and bloating.   Drink enough water and fluids to keep your urine clear or pale yellow. Water, juice, or caffeine-free drinks are recommended. Not drinking enough fluid may cause constipation.   Eat a variety of high-fiber foods rather than one type of fiber.   Try to increase your intake of fiber through using high-fiber foods rather than fiber pills or supplements that contain small amounts of fiber.   The goal is to change the types of food eaten. Do not supplement your present diet with high-fiber foods, but replace foods in your present diet.     Diverticulosis Diverticulosis is a common condition that develops when small pouches (diverticula) form in the wall of the colon. The risk of diverticulosis increases with age. It happens more often in people who eat a low-fiber diet. Most individuals with diverticulosis have no symptoms. Those individuals with symptoms usually experience belly (abdominal) pain, constipation, or loose stools (diarrhea).  HOME CARE INSTRUCTIONS  Increase the amount of fiber in your diet as directed by your caregiver or dietician. This may reduce symptoms of diverticulosis.   Drink at least 6 to 8 glasses of water each day to prevent constipation.   Try not to strain when you have a bowel movement.   Avoiding nuts and seeds to prevent complications is NOT NECESSARY.   FOODS HAVING HIGH FIBER CONTENT INCLUDE:  Fruits. Apple, peach, pear, tangerine, raisins, prunes.   Vegetables. Brussels sprouts, asparagus, broccoli, cabbage, carrot, cauliflower, romaine lettuce, spinach, summer squash, tomato, winter squash, zucchini.   Starchy Vegetables. Baked beans, kidney beans, lima beans, split peas, lentils, potatoes (with skin).    SEEK IMMEDIATE MEDICAL CARE IF:  You develop increasing pain or severe bloating.     You have an oral temperature above 101F.   You develop vomiting or bowel movements that are bloody or black.

## 2019-11-14 ENCOUNTER — Other Ambulatory Visit (HOSPITAL_COMMUNITY): Payer: Self-pay | Admitting: Internal Medicine

## 2019-11-14 ENCOUNTER — Other Ambulatory Visit: Payer: Self-pay | Admitting: Internal Medicine

## 2019-11-14 ENCOUNTER — Ambulatory Visit (HOSPITAL_COMMUNITY): Payer: Medicare HMO

## 2019-11-14 DIAGNOSIS — K219 Gastro-esophageal reflux disease without esophagitis: Secondary | ICD-10-CM | POA: Diagnosis not present

## 2019-11-14 DIAGNOSIS — R1310 Dysphagia, unspecified: Secondary | ICD-10-CM

## 2019-11-14 DIAGNOSIS — E1165 Type 2 diabetes mellitus with hyperglycemia: Secondary | ICD-10-CM | POA: Diagnosis not present

## 2019-11-14 DIAGNOSIS — E663 Overweight: Secondary | ICD-10-CM | POA: Diagnosis not present

## 2019-11-14 DIAGNOSIS — I10 Essential (primary) hypertension: Secondary | ICD-10-CM | POA: Diagnosis not present

## 2019-11-14 DIAGNOSIS — E063 Autoimmune thyroiditis: Secondary | ICD-10-CM | POA: Diagnosis not present

## 2019-11-14 DIAGNOSIS — Z1231 Encounter for screening mammogram for malignant neoplasm of breast: Secondary | ICD-10-CM

## 2019-11-14 DIAGNOSIS — Z6827 Body mass index (BMI) 27.0-27.9, adult: Secondary | ICD-10-CM | POA: Diagnosis not present

## 2019-11-27 ENCOUNTER — Ambulatory Visit (HOSPITAL_COMMUNITY): Payer: Medicare HMO

## 2020-02-18 DIAGNOSIS — B351 Tinea unguium: Secondary | ICD-10-CM | POA: Diagnosis not present

## 2020-02-18 DIAGNOSIS — E063 Autoimmune thyroiditis: Secondary | ICD-10-CM | POA: Diagnosis not present

## 2020-02-18 DIAGNOSIS — I10 Essential (primary) hypertension: Secondary | ICD-10-CM | POA: Diagnosis not present

## 2020-02-18 DIAGNOSIS — Z6827 Body mass index (BMI) 27.0-27.9, adult: Secondary | ICD-10-CM | POA: Diagnosis not present

## 2020-02-18 DIAGNOSIS — Z23 Encounter for immunization: Secondary | ICD-10-CM | POA: Diagnosis not present

## 2020-02-18 DIAGNOSIS — E118 Type 2 diabetes mellitus with unspecified complications: Secondary | ICD-10-CM | POA: Diagnosis not present

## 2020-04-16 DIAGNOSIS — E119 Type 2 diabetes mellitus without complications: Secondary | ICD-10-CM | POA: Diagnosis not present

## 2020-04-16 DIAGNOSIS — Z7984 Long term (current) use of oral hypoglycemic drugs: Secondary | ICD-10-CM | POA: Diagnosis not present

## 2020-04-16 DIAGNOSIS — Z0100 Encounter for examination of eyes and vision without abnormal findings: Secondary | ICD-10-CM | POA: Diagnosis not present

## 2020-04-16 DIAGNOSIS — H5210 Myopia, unspecified eye: Secondary | ICD-10-CM | POA: Diagnosis not present

## 2020-04-16 DIAGNOSIS — H16223 Keratoconjunctivitis sicca, not specified as Sjogren's, bilateral: Secondary | ICD-10-CM | POA: Diagnosis not present

## 2020-04-16 DIAGNOSIS — H5213 Myopia, bilateral: Secondary | ICD-10-CM | POA: Diagnosis not present

## 2020-05-22 DIAGNOSIS — E663 Overweight: Secondary | ICD-10-CM | POA: Diagnosis not present

## 2020-05-22 DIAGNOSIS — J45909 Unspecified asthma, uncomplicated: Secondary | ICD-10-CM | POA: Diagnosis not present

## 2020-05-22 DIAGNOSIS — Z0001 Encounter for general adult medical examination with abnormal findings: Secondary | ICD-10-CM | POA: Diagnosis not present

## 2020-05-22 DIAGNOSIS — Z1389 Encounter for screening for other disorder: Secondary | ICD-10-CM | POA: Diagnosis not present

## 2020-05-22 DIAGNOSIS — E1165 Type 2 diabetes mellitus with hyperglycemia: Secondary | ICD-10-CM | POA: Diagnosis not present

## 2020-05-22 DIAGNOSIS — I10 Essential (primary) hypertension: Secondary | ICD-10-CM | POA: Diagnosis not present

## 2020-05-22 DIAGNOSIS — K219 Gastro-esophageal reflux disease without esophagitis: Secondary | ICD-10-CM | POA: Diagnosis not present

## 2020-05-22 DIAGNOSIS — F419 Anxiety disorder, unspecified: Secondary | ICD-10-CM | POA: Diagnosis not present

## 2020-05-22 DIAGNOSIS — H9113 Presbycusis, bilateral: Secondary | ICD-10-CM | POA: Diagnosis not present

## 2020-05-22 DIAGNOSIS — N202 Calculus of kidney with calculus of ureter: Secondary | ICD-10-CM | POA: Diagnosis not present

## 2020-05-22 DIAGNOSIS — Z6827 Body mass index (BMI) 27.0-27.9, adult: Secondary | ICD-10-CM | POA: Diagnosis not present

## 2020-05-22 DIAGNOSIS — E119 Type 2 diabetes mellitus without complications: Secondary | ICD-10-CM | POA: Diagnosis not present

## 2020-06-05 DIAGNOSIS — L501 Idiopathic urticaria: Secondary | ICD-10-CM | POA: Diagnosis not present

## 2020-06-05 DIAGNOSIS — J301 Allergic rhinitis due to pollen: Secondary | ICD-10-CM | POA: Diagnosis not present

## 2020-06-05 DIAGNOSIS — Z91018 Allergy to other foods: Secondary | ICD-10-CM | POA: Diagnosis not present

## 2020-06-05 DIAGNOSIS — J3089 Other allergic rhinitis: Secondary | ICD-10-CM | POA: Diagnosis not present

## 2020-07-15 DIAGNOSIS — H905 Unspecified sensorineural hearing loss: Secondary | ICD-10-CM | POA: Diagnosis not present

## 2020-08-24 ENCOUNTER — Other Ambulatory Visit (HOSPITAL_COMMUNITY): Payer: Self-pay | Admitting: Internal Medicine

## 2020-08-24 DIAGNOSIS — B353 Tinea pedis: Secondary | ICD-10-CM | POA: Diagnosis not present

## 2020-08-24 DIAGNOSIS — E1165 Type 2 diabetes mellitus with hyperglycemia: Secondary | ICD-10-CM | POA: Diagnosis not present

## 2020-08-24 DIAGNOSIS — E119 Type 2 diabetes mellitus without complications: Secondary | ICD-10-CM | POA: Diagnosis not present

## 2020-08-24 DIAGNOSIS — I10 Essential (primary) hypertension: Secondary | ICD-10-CM | POA: Diagnosis not present

## 2020-08-24 DIAGNOSIS — Z6826 Body mass index (BMI) 26.0-26.9, adult: Secondary | ICD-10-CM | POA: Diagnosis not present

## 2020-08-24 DIAGNOSIS — K219 Gastro-esophageal reflux disease without esophagitis: Secondary | ICD-10-CM | POA: Diagnosis not present

## 2020-08-24 DIAGNOSIS — E663 Overweight: Secondary | ICD-10-CM | POA: Diagnosis not present

## 2020-08-24 DIAGNOSIS — Z1231 Encounter for screening mammogram for malignant neoplasm of breast: Secondary | ICD-10-CM

## 2020-08-24 DIAGNOSIS — K529 Noninfective gastroenteritis and colitis, unspecified: Secondary | ICD-10-CM | POA: Diagnosis not present

## 2020-08-24 DIAGNOSIS — B351 Tinea unguium: Secondary | ICD-10-CM | POA: Diagnosis not present

## 2020-09-21 ENCOUNTER — Ambulatory Visit (HOSPITAL_COMMUNITY)
Admission: RE | Admit: 2020-09-21 | Discharge: 2020-09-21 | Disposition: A | Discharge status: Home / Self Care | Payer: Medicare HMO | Source: Ambulatory Visit | Attending: Internal Medicine | Admitting: Internal Medicine

## 2020-09-21 ENCOUNTER — Other Ambulatory Visit: Payer: Self-pay

## 2020-09-21 DIAGNOSIS — Z1231 Encounter for screening mammogram for malignant neoplasm of breast: Secondary | ICD-10-CM | POA: Diagnosis not present

## 2020-11-30 DIAGNOSIS — Z6827 Body mass index (BMI) 27.0-27.9, adult: Secondary | ICD-10-CM | POA: Diagnosis not present

## 2020-11-30 DIAGNOSIS — E119 Type 2 diabetes mellitus without complications: Secondary | ICD-10-CM | POA: Diagnosis not present

## 2020-11-30 DIAGNOSIS — E663 Overweight: Secondary | ICD-10-CM | POA: Diagnosis not present

## 2021-02-18 DIAGNOSIS — E063 Autoimmune thyroiditis: Secondary | ICD-10-CM | POA: Diagnosis not present

## 2021-02-18 DIAGNOSIS — I10 Essential (primary) hypertension: Secondary | ICD-10-CM | POA: Diagnosis not present

## 2021-02-18 DIAGNOSIS — E118 Type 2 diabetes mellitus with unspecified complications: Secondary | ICD-10-CM | POA: Diagnosis not present

## 2021-02-18 DIAGNOSIS — Z23 Encounter for immunization: Secondary | ICD-10-CM | POA: Diagnosis not present

## 2021-02-18 DIAGNOSIS — E663 Overweight: Secondary | ICD-10-CM | POA: Diagnosis not present

## 2021-02-18 DIAGNOSIS — F419 Anxiety disorder, unspecified: Secondary | ICD-10-CM | POA: Diagnosis not present

## 2021-02-18 DIAGNOSIS — Z6827 Body mass index (BMI) 27.0-27.9, adult: Secondary | ICD-10-CM | POA: Diagnosis not present

## 2021-02-18 DIAGNOSIS — G4700 Insomnia, unspecified: Secondary | ICD-10-CM | POA: Diagnosis not present

## 2021-05-24 DIAGNOSIS — E1165 Type 2 diabetes mellitus with hyperglycemia: Secondary | ICD-10-CM | POA: Diagnosis not present

## 2021-05-24 DIAGNOSIS — E118 Type 2 diabetes mellitus with unspecified complications: Secondary | ICD-10-CM | POA: Diagnosis not present

## 2021-05-24 DIAGNOSIS — Z6827 Body mass index (BMI) 27.0-27.9, adult: Secondary | ICD-10-CM | POA: Diagnosis not present

## 2021-05-24 DIAGNOSIS — I10 Essential (primary) hypertension: Secondary | ICD-10-CM | POA: Diagnosis not present

## 2021-05-24 DIAGNOSIS — E063 Autoimmune thyroiditis: Secondary | ICD-10-CM | POA: Diagnosis not present

## 2021-05-24 DIAGNOSIS — Z1331 Encounter for screening for depression: Secondary | ICD-10-CM | POA: Diagnosis not present

## 2021-05-24 DIAGNOSIS — K222 Esophageal obstruction: Secondary | ICD-10-CM | POA: Diagnosis not present

## 2021-05-24 DIAGNOSIS — E663 Overweight: Secondary | ICD-10-CM | POA: Diagnosis not present

## 2021-05-24 DIAGNOSIS — Z0001 Encounter for general adult medical examination with abnormal findings: Secondary | ICD-10-CM | POA: Diagnosis not present

## 2021-05-24 DIAGNOSIS — G43909 Migraine, unspecified, not intractable, without status migrainosus: Secondary | ICD-10-CM | POA: Diagnosis not present

## 2021-06-01 DIAGNOSIS — Z0001 Encounter for general adult medical examination with abnormal findings: Secondary | ICD-10-CM | POA: Diagnosis not present

## 2021-06-01 DIAGNOSIS — E118 Type 2 diabetes mellitus with unspecified complications: Secondary | ICD-10-CM | POA: Diagnosis not present

## 2021-06-01 DIAGNOSIS — E063 Autoimmune thyroiditis: Secondary | ICD-10-CM | POA: Diagnosis not present

## 2021-06-22 DIAGNOSIS — Z91018 Allergy to other foods: Secondary | ICD-10-CM | POA: Diagnosis not present

## 2021-06-22 DIAGNOSIS — J301 Allergic rhinitis due to pollen: Secondary | ICD-10-CM | POA: Diagnosis not present

## 2021-06-22 DIAGNOSIS — L501 Idiopathic urticaria: Secondary | ICD-10-CM | POA: Diagnosis not present

## 2021-06-22 DIAGNOSIS — J3089 Other allergic rhinitis: Secondary | ICD-10-CM | POA: Diagnosis not present

## 2021-08-05 ENCOUNTER — Encounter: Payer: Self-pay | Admitting: Internal Medicine

## 2021-08-05 ENCOUNTER — Ambulatory Visit (INDEPENDENT_AMBULATORY_CARE_PROVIDER_SITE_OTHER): Payer: Medicare HMO | Admitting: Internal Medicine

## 2021-08-05 VITALS — BP 102/64 | HR 66 | Temp 96.8°F | Ht 64.0 in | Wt 157.6 lb

## 2021-08-05 DIAGNOSIS — K219 Gastro-esophageal reflux disease without esophagitis: Secondary | ICD-10-CM

## 2021-08-05 DIAGNOSIS — K222 Esophageal obstruction: Secondary | ICD-10-CM | POA: Diagnosis not present

## 2021-08-05 DIAGNOSIS — R1319 Other dysphagia: Secondary | ICD-10-CM

## 2021-08-05 NOTE — Progress Notes (Signed)
? ? ?Referring Provider: Elfredia Nevins, MD ?Primary Care Physician:  Elfredia Nevins, MD ?Primary GI:  Dr. Marletta Lor ? ?Chief Complaint  ?Patient presents with  ? Follow-up  ?  Pt having issues with her reflux  ? ? ?HPI:   ?Gina Osborne is a 61 y.o. female who presents to clinic today for follow-up visit.  Last seen 2021.  Has a history of GERD which is well controlled on pantoprazole 40 mg daily.  History of dysphagia status post dilation 2016 with improvement of her symptoms.  EGD did make mention of esophageal stricture. ? ?Today she states she is doing okay.  Reflux well controlled on pantoprazole daily.  Has noted more intermittent epigastric discomfort as well as occasional dysphagia. ? ?Up-to-date on colon cancer screening with colonoscopy without polyps 08/16/2019.  10-year recall. ? ?Past Medical History:  ?Diagnosis Date  ? Diabetes mellitus without complication (HCC)   ? Hypertension   ? Migraine   ? Thyroid disease   ? ? ?Past Surgical History:  ?Procedure Laterality Date  ? ABDOMINAL HYSTERECTOMY    ? CHOLECYSTECTOMY    ? COLONOSCOPY N/A 08/16/2019  ? Procedure: COLONOSCOPY;  Surgeon: West Bali, MD;  Location: [facility];  Service: Endoscopy;  Laterality: N/A;  1:00pm  ? ESOPHAGOGASTRODUODENOSCOPY N/A 10/31/2014  ? ZOX:WRUEA HH/mild non-erosive gastritis/dysphagia due to stricture  ? SAVORY DILATION N/A 10/31/2014  ? Procedure: SAVORY DILATION;  Surgeon: West Bali, MD;  Location: [facility];  Service: Endoscopy;  Laterality: N/A;  ? ? ?Current Outpatient Medications  ?Medication Sig Dispense Refill  ? atorvastatin (LIPITOR) 10 MG tablet Take 1 tablet by mouth daily.    ? canagliflozin (INVOKANA) 100 MG TABS tablet Take by mouth daily before breakfast.    ? Flaxseed, Linseed, (FLAXSEED OIL) 1000 MG CAPS Take by mouth 2 (two) times daily.    ? glimepiride (AMARYL) 4 MG tablet Take 1 tablet by mouth 2 (two) times daily.     ? levothyroxine (SYNTHROID, LEVOTHROID) 50 MCG tablet Take 50 mcg by mouth  daily.     ? lisinopril (PRINIVIL,ZESTRIL) 2.5 MG tablet Take 2.5 mg by mouth daily.     ? loratadine (CLARITIN) 10 MG tablet Take 1 tablet by mouth daily.    ? pantoprazole (PROTONIX) 40 MG tablet TAKE ONE TABLET BY MOUTH ONCE DAILY 30 MINUTES  PRIOR TO YOUR FIRST MEAL FOREVER (Patient taking differently: Take 40 mg by mouth daily.) 30 tablet 5  ? phentermine (ADIPEX-P) 37.5 MG tablet Take 37.5 mg by mouth daily before breakfast.     ? sitaGLIPtin (JANUVIA) 100 MG tablet Take 100 mg by mouth daily.    ? topiramate (TOPAMAX) 100 MG tablet Take 100 mg by mouth at bedtime.    ? traZODone (DESYREL) 50 MG tablet Take 150 mg by mouth at bedtime.     ? glimepiride (AMARYL) 2 MG tablet Take 6 mg by mouth daily with breakfast. (Patient not taking: Reported on 08/05/2021)    ? ?No current facility-administered medications for this visit.  ? ? ?Allergies as of 08/05/2021 - Review Complete 08/05/2021  ?Allergen Reaction Noted  ? Darvocet [propoxyphene n-acetaminophen]  08/04/2011  ? ? ?Family History  ?Problem Relation Age of Onset  ? Pancreatic cancer Father   ? Colon cancer Neg Hx   ? ? ?Social History  ? ?Socioeconomic History  ? Marital status: Divorced  ?  Spouse name: Not on file  ? Number of children: Not on file  ? Years  of education: Not on file  ? Highest education level: Not on file  ?Occupational History  ? Not on file  ?Tobacco Use  ? Smoking status: Former  ? Smokeless tobacco: Never  ? Tobacco comments:  ?  quit 2004  ?Substance and Sexual Activity  ? Alcohol use: No  ? Drug use: No  ? Sexual activity: Yes  ?  Birth control/protection: None  ?Other Topics Concern  ? Not on file  ?Social History Narrative  ? Not on file  ? ?Social Determinants of Health  ? ?Financial Resource Strain: Not on file  ?Food Insecurity: Not on file  ?Transportation Needs: Not on file  ?Physical Activity: Not on file  ?Stress: Not on file  ?Social Connections: Not on file  ? ? ?Subjective: ?Review of Systems  ?Constitutional:  Negative  for chills and fever.  ?HENT:  Negative for congestion and hearing loss.   ?Eyes:  Negative for blurred vision and double vision.  ?Respiratory:  Negative for cough and shortness of breath.   ?Cardiovascular:  Negative for chest pain and palpitations.  ?Gastrointestinal:  Positive for heartburn. Negative for abdominal pain, blood in stool, constipation, diarrhea, melena and vomiting.  ?     Esophageal dysphagia   ?Genitourinary:  Negative for dysuria and urgency.  ?Musculoskeletal:  Negative for joint pain and myalgias.  ?Skin:  Negative for itching and rash.  ?Neurological:  Negative for dizziness and headaches.  ?Psychiatric/Behavioral:  Negative for depression. The patient is not nervous/anxious.   ? ? ?Objective: ?BP 102/64   Pulse 66   Temp (!) 96.8 ?F (36 ?C)   Ht 5\' 4"  (1.626 m)   Wt 157 lb 9.6 oz (71.5 kg)   BMI 27.05 kg/m?  ?Physical Exam ?Constitutional:   ?   Appearance: Normal appearance.  ?HENT:  ?   Head: Normocephalic and atraumatic.  ?Eyes:  ?   Extraocular Movements: Extraocular movements intact.  ?   Conjunctiva/sclera: Conjunctivae normal.  ?Cardiovascular:  ?   Rate and Rhythm: Normal rate and regular rhythm.  ?Pulmonary:  ?   Effort: Pulmonary effort is normal.  ?   Breath sounds: Normal breath sounds.  ?Abdominal:  ?   General: Bowel sounds are normal.  ?   Palpations: Abdomen is soft.  ?Musculoskeletal:     ?   General: No swelling. Normal range of motion.  ?   Cervical back: Normal range of motion and neck supple.  ?Skin: ?   General: Skin is warm and dry.  ?   Coloration: Skin is not jaundiced.  ?Neurological:  ?   General: No focal deficit present.  ?   Mental Status: She is alert and oriented to person, place, and time.  ?Psychiatric:     ?   Mood and Affect: Mood normal.     ?   Behavior: Behavior normal.  ? ? ? ?Assessment: ?*GERD-relatively well controlled on Pantoprazole ?*Esophageal dysphagia-intermittent, worsening ?*Esophageal stricture ? ?Plan: ?Will schedule for EGD with  possible dilation to evaluate for peptic ulcer disease, esophagitis, gastritis, H. Pylori, duodenitis, or other. Will also evaluate for esophageal stricture, Schatzki's ring, esophageal web or other.  ? ?The risks including infection, bleed, or perforation as well as benefits, limitations, alternatives and imponderables have been reviewed with the patient. Potential for esophageal dilation, biopsy, etc. have also been reviewed.  Questions have been answered. All parties agreeable. ? ?Continue on pantoprazole daily. ? ?Patient to discuss potential dates with daughter prior to scheduling.  Discussed that  all she needs to do is call our office and we will set this up.  ASA 2. ? ? ?08/05/2021 2:49 PM ? ? ?Disclaimer: This note was dictated with voice recognition software. Similar sounding words can inadvertently be transcribed and may not be corrected upon review. ? ?

## 2021-08-05 NOTE — Patient Instructions (Addendum)
We will schedule you for upper endoscopy to further evaluate your chronic reflux, epigastric discomfort. ? ?After you speak with your daughter, then call our office and we will get you scheduled. ? ?Continue on pantoprazole daily. ? ?It was very nice meeting you today. ? ?Dr. Marletta Lor ? ?At [facility] we value your feedback. You may receive a survey about your visit today. Please share your experience as we strive to create trusting relationships with our patients to provide genuine, compassionate, quality care. ? ?We appreciate your understanding and patience as we review any laboratory studies, imaging, and other diagnostic tests that are ordered as we care for you. Our office policy is 5 business days for review of these results, and any emergent or urgent results are addressed in a timely manner for your best interest. If you do not hear from our office in 1 week, please contact us.  ? ?We also encourage the use of MyChart, which contains your medical information for your review as well. If you are not enrolled in this feature, an access code is on this after visit summary for your convenience. Thank you for allowing [place] to be involved in your care. ? ?It was great to see you today!  I hope you have a great rest of your Spring! ? ? ? ?Hennie Duos. Marletta Lor, D.O. ?Gastroenterology and Hepatology ?[facility] ? ?

## 2021-08-12 ENCOUNTER — Telehealth: Payer: Self-pay

## 2021-08-12 NOTE — Telephone Encounter (Signed)
Spoke to pt, EGD/DIL scheduled for 09/17/21 at 12:00pm. Orders entered. Instructions mailed. ? ?PA for EGD/DIL submitted via [employer] website. PA# [id number], valid 09/17/21-12/16/21. ?

## 2021-08-12 NOTE — Telephone Encounter (Signed)
Called pt to schedule EGD/DIL ASA 2 w/Dr. Marletta Lor, she will call back to schedule after she speaks to her daughter in [place]. ?

## 2021-08-23 DIAGNOSIS — K219 Gastro-esophageal reflux disease without esophagitis: Secondary | ICD-10-CM | POA: Diagnosis not present

## 2021-08-23 DIAGNOSIS — E063 Autoimmune thyroiditis: Secondary | ICD-10-CM | POA: Diagnosis not present

## 2021-08-23 DIAGNOSIS — E119 Type 2 diabetes mellitus without complications: Secondary | ICD-10-CM | POA: Diagnosis not present

## 2021-08-23 DIAGNOSIS — K222 Esophageal obstruction: Secondary | ICD-10-CM | POA: Diagnosis not present

## 2021-08-23 DIAGNOSIS — I10 Essential (primary) hypertension: Secondary | ICD-10-CM | POA: Diagnosis not present

## 2021-08-23 DIAGNOSIS — Z6827 Body mass index (BMI) 27.0-27.9, adult: Secondary | ICD-10-CM | POA: Diagnosis not present

## 2021-08-23 DIAGNOSIS — E663 Overweight: Secondary | ICD-10-CM | POA: Diagnosis not present

## 2021-09-17 ENCOUNTER — Encounter (HOSPITAL_COMMUNITY)
Admission: RE | Disposition: A | Discharge status: Home / Self Care | Payer: Self-pay | Source: Ambulatory Visit | Attending: Internal Medicine

## 2021-09-17 ENCOUNTER — Encounter (HOSPITAL_COMMUNITY): Payer: Self-pay

## 2021-09-17 ENCOUNTER — Ambulatory Visit (HOSPITAL_BASED_OUTPATIENT_CLINIC_OR_DEPARTMENT_OTHER): Payer: Medicare HMO | Admitting: Certified Registered Nurse Anesthetist

## 2021-09-17 ENCOUNTER — Ambulatory Visit (HOSPITAL_COMMUNITY): Payer: Medicare HMO | Admitting: Certified Registered Nurse Anesthetist

## 2021-09-17 ENCOUNTER — Other Ambulatory Visit: Payer: Self-pay

## 2021-09-17 ENCOUNTER — Ambulatory Visit (HOSPITAL_COMMUNITY)
Admission: RE | Admit: 2021-09-17 | Discharge: 2021-09-17 | Disposition: A | Discharge status: Home / Self Care | Payer: Medicare HMO | Source: Ambulatory Visit | Attending: Internal Medicine | Admitting: Internal Medicine

## 2021-09-17 DIAGNOSIS — K222 Esophageal obstruction: Secondary | ICD-10-CM | POA: Insufficient documentation

## 2021-09-17 DIAGNOSIS — R1310 Dysphagia, unspecified: Secondary | ICD-10-CM | POA: Diagnosis not present

## 2021-09-17 DIAGNOSIS — E119 Type 2 diabetes mellitus without complications: Secondary | ICD-10-CM | POA: Diagnosis not present

## 2021-09-17 DIAGNOSIS — K2100 Gastro-esophageal reflux disease with esophagitis, without bleeding: Secondary | ICD-10-CM | POA: Insufficient documentation

## 2021-09-17 DIAGNOSIS — Z87891 Personal history of nicotine dependence: Secondary | ICD-10-CM | POA: Insufficient documentation

## 2021-09-17 DIAGNOSIS — K2289 Other specified disease of esophagus: Secondary | ICD-10-CM | POA: Insufficient documentation

## 2021-09-17 DIAGNOSIS — K449 Diaphragmatic hernia without obstruction or gangrene: Secondary | ICD-10-CM | POA: Diagnosis not present

## 2021-09-17 DIAGNOSIS — I10 Essential (primary) hypertension: Secondary | ICD-10-CM | POA: Diagnosis not present

## 2021-09-17 DIAGNOSIS — K209 Esophagitis, unspecified without bleeding: Secondary | ICD-10-CM | POA: Diagnosis not present

## 2021-09-17 HISTORY — PX: BALLOON DILATION: SHX5330

## 2021-09-17 HISTORY — PX: BIOPSY: SHX5522

## 2021-09-17 HISTORY — PX: ESOPHAGOGASTRODUODENOSCOPY (EGD) WITH PROPOFOL: SHX5813

## 2021-09-17 LAB — GLUCOSE, CAPILLARY: Glucose-Capillary: 203 mg/dL — ABNORMAL HIGH (ref 70–99)

## 2021-09-17 SURGERY — ESOPHAGOGASTRODUODENOSCOPY (EGD) WITH PROPOFOL
Anesthesia: General

## 2021-09-17 MED ORDER — LACTATED RINGERS IV SOLN: INTRAVENOUS | Status: DC

## 2021-09-17 MED ORDER — PROPOFOL 10 MG/ML IV BOLUS
INTRAVENOUS | Status: DC | PRN
  Administered 2021-09-17: 100 mg via INTRAVENOUS

## 2021-09-17 MED ORDER — LIDOCAINE HCL (CARDIAC) PF 100 MG/5ML IV SOSY
PREFILLED_SYRINGE | INTRAVENOUS | Status: DC | PRN
  Administered 2021-09-17: 50 mg via INTRATRACHEAL

## 2021-09-17 NOTE — Transfer of Care (Signed)
Immediate Anesthesia Transfer of Care Note  Patient: Gina Osborne  Procedure(s) Performed: ESOPHAGOGASTRODUODENOSCOPY (EGD) WITH PROPOFOL BALLOON DILATION BIOPSY  Patient Location: PACU  Anesthesia Type:General  Level of Consciousness: sedated  Airway & Oxygen Therapy: Patient Spontanous Breathing  Post-op Assessment: Report given to RN, Post -op Vital signs reviewed and stable and Patient moving all extremities X 4  Post vital signs: Reviewed and stable  Last Vitals:  Vitals Value Taken Time  BP 95/65 09/17/21 1058  Temp 36.4 C 09/17/21 1055  Pulse    Resp 21 09/17/21 1055  SpO2 95 % 09/17/21 1055    Last Pain:  Vitals:   09/17/21 1058  TempSrc:   PainSc: 0-No pain         Complications: No notable events documented.

## 2021-09-17 NOTE — Discharge Instructions (Addendum)
EGD Discharge instructions Please read the instructions outlined below and refer to this sheet in the next few weeks. These discharge instructions provide you with general information on caring for yourself after you leave the hospital. Your doctor may also give you specific instructions. While your treatment has been planned according to the most current medical practices available, unavoidable complications occasionally occur. If you have any problems or questions after discharge, please call your doctor. ACTIVITY You may resume your regular activity but move at a slower pace for the next 24 hours.  Take frequent rest periods for the next 24 hours.  Walking will help expel (get rid of) the air and reduce the bloated feeling in your abdomen.  No driving for 24 hours (because of the anesthesia (medicine) used during the test).  You may shower.  Do not sign any important legal documents or operate any machinery for 24 hours (because of the anesthesia used during the test).  NUTRITION Drink plenty of fluids.  You may resume your normal diet.  Begin with a light meal and progress to your normal diet.  Avoid alcoholic beverages for 24 hours or as instructed by your caregiver.  MEDICATIONS You may resume your normal medications unless your caregiver tells you otherwise.  WHAT YOU CAN EXPECT TODAY You may experience abdominal discomfort such as a feeling of fullness or "gas" pains.  FOLLOW-UP Your doctor will discuss the results of your test with you.  SEEK IMMEDIATE MEDICAL ATTENTION IF ANY OF THE FOLLOWING OCCUR: Excessive nausea (feeling sick to your stomach) and/or vomiting.  Severe abdominal pain and distention (swelling).  Trouble swallowing.  Temperature over 101 F (37.8 C).  Rectal bleeding or vomiting of blood.   You had a tightening of your esophagus called a Schatzki's ring.  I dilated this today.  Hopefully this helps with your swallowing.  I also took samples of your esophagus as  well.  Await pathology results, my office will contact you.  Continue on pantoprazole daily.  Follow-up with GI in 6 months; left message with the office and they will call you to schedule this appointment.    I hope you have a great rest of your week!  Hennie Duos. Marletta Lor, D.O. Gastroenterology and Hepatology [facility]

## 2021-09-17 NOTE — Op Note (Signed)
[facility] Patient Name: Gina Osborne Procedure Date: 09/17/2021 10:40 AM MRN: [id number] Date of Birth: [birth year] Attending MD: Elon Alas. Edgar Frisk CSN: [id number] Age: 61 Admit Type: Outpatient Procedure:                Upper GI endoscopy Indications:              Dysphagia, Heartburn Providers:                Elon Alas. Abbey Chatters, DO, Caprice Kluver, Raphael Gibney,                            Technician Referring MD:              Medicines:                See the Anesthesia note for documentation of the                            administered medications Complications:            No immediate complications. Estimated Blood Loss:     Estimated blood loss was minimal. Procedure:                Pre-Anesthesia Assessment:                           - The anesthesia plan was to use monitored                            anesthesia care (MAC).                           After obtaining informed consent, the endoscope was                            passed under direct vision. Throughout the                            procedure, the patient's blood pressure, pulse, and                            oxygen saturations were monitored continuously. The                            GIF-H190 ([id number]) scope was introduced through the                            mouth, and advanced to the second part of duodenum.                            The upper GI endoscopy was accomplished without                            difficulty. The patient tolerated the procedure                            well. Scope In: 10:46:55 AM Scope Out:  10:52:06 AM Total Procedure Duration: 0 hours 5 minutes 11 seconds  Findings:      A small hiatal hernia was present.      A low-grade of narrowing and mild Schatzki ring was found in the distal       esophagus. A TTS dilator was passed through the scope. Dilation with an       18-19-20 mm balloon dilator was performed to 18 mm. The dilation site       was examined and showed mild  mucosal disruption and moderate improvement       in luminal narrowing. Ring was then further disrupted with cold forceps.      Mucosal changes including ringed esophagus were found in the middle       third of the esophagus. Biopsies were taken with a cold forceps for       histology.      The entire examined stomach was normal.      The duodenal bulb, first portion of the duodenum and second portion of       the duodenum were normal. Impression:               - Small hiatal hernia.                           - Low-grade of narrowing and mild Schatzki ring.                            Dilated.                           - Esophageal mucosal changes suspicious for                            eosinophilic esophagitis. Biopsied.                           - Normal stomach.                           - Normal duodenal bulb, first portion of the                            duodenum and second portion of the duodenum. Moderate Sedation:      Per Anesthesia Care Recommendation:           - Patient has a contact number available for                            emergencies. The signs and symptoms of potential                            delayed complications were discussed with the                            patient. Return to normal activities tomorrow.                            Written discharge instructions were provided to the  patient.                           - Resume previous diet.                           - Continue present medications.                           - Await pathology results.                           - Repeat upper endoscopy PRN for retreatment.                           - Return to GI clinic in 4 months.                           - Use Protonix (pantoprazole) 40 mg PO daily. Procedure Code(s):        --- Professional ---                           [phone], Esophagogastroduodenoscopy, flexible,                            transoral; with transendoscopic  balloon dilation of                            esophagus (less than 30 mm diameter)                           43239, 59, Esophagogastroduodenoscopy, flexible,                            transoral; with biopsy, single or multiple Diagnosis Code(s):        --- Professional ---                           K44.9, Diaphragmatic hernia without obstruction or                            gangrene                           K22.2, Esophageal obstruction                           K22.8, Other specified diseases of esophagus                           R13.10, Dysphagia, unspecified                           R12, Heartburn CPT copyright 2019 American Medical Association. All rights reserved. The codes documented in this report are preliminary and upon coder review may  be revised to meet current compliance requirements. Elon Alas. Abbey Chatters, DO [place] Abbey Chatters, DO 09/17/2021 10:55:32 AM This report has been signed electronically.  Number of Addenda: 0 

## 2021-09-17 NOTE — Anesthesia Preprocedure Evaluation (Signed)
Anesthesia Evaluation  Patient identified by MRN, date of birth, ID band Patient awake    Reviewed: Allergy & Precautions, H&P , NPO status , Patient's Chart, lab work & pertinent test results, reviewed documented beta blocker date and time   Airway Mallampati: II  TM Distance: >3 FB Neck ROM: full    Dental no notable dental hx.    Pulmonary neg pulmonary ROS, former smoker,    Pulmonary exam normal breath sounds clear to auscultation       Cardiovascular Exercise Tolerance: Good hypertension, negative cardio ROS   Rhythm:regular Rate:Normal     Neuro/Psych  Headaches, negative psych ROS   GI/Hepatic Neg liver ROS, GERD  Medicated,  Endo/Other  negative endocrine ROSdiabetes, Type 2  Renal/GU negative Renal ROS  negative genitourinary   Musculoskeletal   Abdominal   Peds  Hematology negative hematology ROS (+)   Anesthesia Other Findings   Reproductive/Obstetrics negative OB ROS                             Anesthesia Physical Anesthesia Plan  ASA: 2  Anesthesia Plan: General   Post-op Pain Management:    Induction:   PONV Risk Score and Plan: Propofol infusion  Airway Management Planned:   Additional Equipment:   Intra-op Plan:   Post-operative Plan:   Informed Consent: I have reviewed the patients History and Physical, chart, labs and discussed the procedure including the risks, benefits and alternatives for the proposed anesthesia with the patient or authorized representative who has indicated his/her understanding and acceptance.     Dental Advisory Given  Plan Discussed with: CRNA  Anesthesia Plan Comments:         Anesthesia Quick Evaluation

## 2021-09-17 NOTE — H&P (Signed)
Primary Care Physician:  Elfredia Nevins, MD Primary Gastroenterologist:  Dr. Marletta Lor  Pre-Procedure History & Physical: HPI:  Gina Osborne is a 61 y.o. female is here for an EGD with possible dilation to be performed for GERD and dysphagia   Past Medical History:  Diagnosis Date   Diabetes mellitus without complication (HCC)    Hypertension    Migraine    Thyroid disease     Past Surgical History:  Procedure Laterality Date   ABDOMINAL HYSTERECTOMY     CHOLECYSTECTOMY     COLONOSCOPY N/A 08/16/2019   Procedure: COLONOSCOPY;  Surgeon: West Bali, MD;  [facility]: [facility];  Service: Endoscopy;  Laterality: N/A;  1:00pm   ESOPHAGOGASTRODUODENOSCOPY N/A 10/31/2014   [id number] HH/mild non-erosive gastritis/dysphagia due to stricture   SAVORY DILATION N/A 10/31/2014   Procedure: SAVORY DILATION;  Surgeon: West Bali, MD;  Location: [facility];  Service: Endoscopy;  Laterality: N/A;    Prior to Admission medications   Medication Sig Start Date End Date Taking? Authorizing Provider  atorvastatin (LIPITOR) 10 MG tablet Take 1 tablet by mouth daily. 04/22/19  Yes [provider]  canagliflozin (INVOKANA) 100 MG TABS tablet Take by mouth daily before breakfast.   Yes [provider]  Flaxseed, Linseed, (FLAXSEED OIL) 1000 MG CAPS Take by mouth 2 (two) times daily.   Yes [provider]  glimepiride (AMARYL) 2 MG tablet Take 6 mg by mouth daily with breakfast.   Yes [provider]  glimepiride (AMARYL) 4 MG tablet Take 1 tablet by mouth 2 (two) times daily.  05/28/19  Yes [provider]  levothyroxine (SYNTHROID, LEVOTHROID) 50 MCG tablet Take 50 mcg by mouth daily.    Yes [provider]  lisinopril (PRINIVIL,ZESTRIL) 2.5 MG tablet Take 2.5 mg by mouth daily.  08/28/14  Yes [provider]  loratadine (CLARITIN) 10 MG tablet Take 1 tablet by mouth daily. 05/04/18  Yes [provider]  phentermine (ADIPEX-P) 37.5  MG tablet Take 37.5 mg by mouth daily before breakfast.    Yes [provider]  topiramate (TOPAMAX) 100 MG tablet Take 100 mg by mouth at bedtime.   Yes [provider]  traZODone (DESYREL) 50 MG tablet Take 150 mg by mouth at bedtime.    Yes [provider]  pantoprazole (PROTONIX) 40 MG tablet TAKE ONE TABLET BY MOUTH ONCE DAILY 30 MINUTES  PRIOR TO YOUR FIRST MEAL FOREVER Patient taking differently: Take 40 mg by mouth daily. 11/16/15   Anice Paganini, NP  sitaGLIPtin (JANUVIA) 100 MG tablet Take 100 mg by mouth daily.    [provider]    Allergies as of 08/12/2021 - Review Complete 08/05/2021  Allergen Reaction Noted   Darvocet [propoxyphene n-acetaminophen]  08/04/2011    Family History  Problem Relation Age of Onset   Pancreatic cancer Father    Colon cancer Neg Hx     Social History   Socioeconomic History   Marital status: Divorced    Spouse name: Not on file   Number of children: Not on file   Years of education: Not on file   Highest education level: Not on file  Occupational History   Not on file  Tobacco Use   Smoking status: Former   Smokeless tobacco: Never   Tobacco comments:    quit 2004  Substance and Sexual Activity   Alcohol use: No   Drug use: No   Sexual activity: Yes    Birth  control/protection: None  Other Topics Concern   Not on file  Social History Narrative   Not on file   Social Determinants of Health   Financial Resource Strain: Not on file  Food Insecurity: Not on file  Transportation Needs: Not on file  Physical Activity: Not on file  Stress: Not on file  Social Connections: Not on file  Intimate Partner Violence: Not on file    Review of Systems: General: Negative for fever, chills, fatigue, weakness. Eyes: Negative for vision changes.  ENT: Negative for hoarseness, difficulty swallowing , nasal congestion. CV: Negative for chest pain, angina, palpitations, dyspnea on exertion, peripheral  edema.  Respiratory: Negative for dyspnea at rest, dyspnea on exertion, cough, sputum, wheezing.  GI: See history of present illness. GU:  Negative for dysuria, hematuria, urinary incontinence, urinary frequency, nocturnal urination.  MS: Negative for joint pain, low back pain.  Derm: Negative for rash or itching.  Neuro: Negative for weakness, abnormal sensation, seizure, frequent headaches, memory loss, confusion.  Psych: Negative for anxiety, depression Endo: Negative for unusual weight change.  Heme: Negative for bruising or bleeding. Allergy: Negative for rash or hives.  Physical Exam: Vital signs in last 24 hours: Temp:  [97.6 F (36.4 C)] 97.6 F (36.4 C) (06/09 1012) Resp:  [16] 16 (06/09 1012) BP: (114)/(82) 114/82 (06/09 1012) SpO2:  [97 %] 97 % (06/09 1012) Weight:  [70.8 kg] 70.8 kg (06/09 1012)   General:   Alert,  Well-developed, well-nourished, pleasant and cooperative in NAD Head:  Normocephalic and atraumatic. Eyes:  Sclera clear, no icterus.   Conjunctiva pink. Ears:  Normal auditory acuity. Nose:  No deformity, discharge,  or lesions. Mouth:  No deformity or lesions, dentition normal. Neck:  Supple; no masses or thyromegaly. Lungs:  Clear throughout to auscultation.   No wheezes, crackles, or rhonchi. No acute distress. Heart:  Regular rate and rhythm; no murmurs, clicks, rubs,  or gallops. Abdomen:  Soft, nontender and nondistended. No masses, hepatosplenomegaly or hernias noted. Normal bowel sounds, without guarding, and without rebound.   Msk:  Symmetrical without gross deformities. Normal posture. Extremities:  Without clubbing or edema. Neurologic:  Alert and  oriented x4;  grossly normal neurologically. Skin:  Intact without significant lesions or rashes. Cervical Nodes:  No significant cervical adenopathy. Psych:  Alert and cooperative. Normal mood and affect.   Impression/Plan: Gina Osborne is here for an EGD with possible dilation to be performed for  GERD and dysphagia   Risks, benefits, limitations, imponderables and alternatives regarding EGD have been reviewed with the patient. Questions have been answered. All parties agreeable.

## 2021-09-19 NOTE — Anesthesia Postprocedure Evaluation (Signed)
Anesthesia Post Note  Patient: Gina Osborne  Procedure(s) Performed: ESOPHAGOGASTRODUODENOSCOPY (EGD) WITH PROPOFOL BALLOON DILATION BIOPSY  Patient location during evaluation: Phase II Anesthesia Type: General Level of consciousness: awake Pain management: pain level controlled Vital Signs Assessment: post-procedure vital signs reviewed and stable Respiratory status: spontaneous breathing and respiratory function stable Cardiovascular status: blood pressure returned to baseline and stable Postop Assessment: no headache and no apparent nausea or vomiting Anesthetic complications: no Comments: Late entry   No notable events documented.   Last Vitals:  Vitals:   09/17/21 1055 09/17/21 1058  BP: (!) 74/43 95/65  Resp: (!) 21   Temp: (!) 36.4 C   SpO2: 95%     Last Pain:  Vitals:   09/17/21 1058  TempSrc:   PainSc: 0-No pain                 Windell Norfolk

## 2021-09-21 LAB — SURGICAL PATHOLOGY

## 2021-09-24 ENCOUNTER — Encounter (HOSPITAL_COMMUNITY): Payer: Self-pay | Admitting: Internal Medicine

## 2021-09-27 ENCOUNTER — Other Ambulatory Visit: Payer: Self-pay | Admitting: Internal Medicine

## 2021-09-27 MED ORDER — FLUCONAZOLE 200 MG PO TABS: ORAL_TABLET | ORAL | 0 refills | Status: DC

## 2021-11-19 DIAGNOSIS — E1165 Type 2 diabetes mellitus with hyperglycemia: Secondary | ICD-10-CM | POA: Diagnosis not present

## 2021-11-19 DIAGNOSIS — K219 Gastro-esophageal reflux disease without esophagitis: Secondary | ICD-10-CM | POA: Diagnosis not present

## 2021-11-19 DIAGNOSIS — E063 Autoimmune thyroiditis: Secondary | ICD-10-CM | POA: Diagnosis not present

## 2021-11-19 DIAGNOSIS — I10 Essential (primary) hypertension: Secondary | ICD-10-CM | POA: Diagnosis not present

## 2021-11-19 DIAGNOSIS — E663 Overweight: Secondary | ICD-10-CM | POA: Diagnosis not present

## 2021-11-19 DIAGNOSIS — Z6827 Body mass index (BMI) 27.0-27.9, adult: Secondary | ICD-10-CM | POA: Diagnosis not present

## 2021-11-25 DIAGNOSIS — Z0001 Encounter for general adult medical examination with abnormal findings: Secondary | ICD-10-CM | POA: Diagnosis not present

## 2021-11-25 DIAGNOSIS — I10 Essential (primary) hypertension: Secondary | ICD-10-CM | POA: Diagnosis not present

## 2021-11-25 DIAGNOSIS — E063 Autoimmune thyroiditis: Secondary | ICD-10-CM | POA: Diagnosis not present

## 2021-11-25 DIAGNOSIS — E118 Type 2 diabetes mellitus with unspecified complications: Secondary | ICD-10-CM | POA: Diagnosis not present

## 2022-02-17 ENCOUNTER — Encounter: Payer: Self-pay | Admitting: Internal Medicine

## 2022-02-28 DIAGNOSIS — E663 Overweight: Secondary | ICD-10-CM | POA: Diagnosis not present

## 2022-02-28 DIAGNOSIS — E119 Type 2 diabetes mellitus without complications: Secondary | ICD-10-CM | POA: Diagnosis not present

## 2022-02-28 DIAGNOSIS — Z6826 Body mass index (BMI) 26.0-26.9, adult: Secondary | ICD-10-CM | POA: Diagnosis not present

## 2022-02-28 DIAGNOSIS — K219 Gastro-esophageal reflux disease without esophagitis: Secondary | ICD-10-CM | POA: Diagnosis not present

## 2022-02-28 DIAGNOSIS — I10 Essential (primary) hypertension: Secondary | ICD-10-CM | POA: Diagnosis not present

## 2022-05-03 DIAGNOSIS — H5213 Myopia, bilateral: Secondary | ICD-10-CM | POA: Diagnosis not present

## 2022-05-03 DIAGNOSIS — H5203 Hypermetropia, bilateral: Secondary | ICD-10-CM | POA: Diagnosis not present

## 2022-06-03 DIAGNOSIS — E1165 Type 2 diabetes mellitus with hyperglycemia: Secondary | ICD-10-CM | POA: Diagnosis not present

## 2022-06-03 DIAGNOSIS — I10 Essential (primary) hypertension: Secondary | ICD-10-CM | POA: Diagnosis not present

## 2022-06-03 DIAGNOSIS — Z6827 Body mass index (BMI) 27.0-27.9, adult: Secondary | ICD-10-CM | POA: Diagnosis not present

## 2022-06-03 DIAGNOSIS — G43909 Migraine, unspecified, not intractable, without status migrainosus: Secondary | ICD-10-CM | POA: Diagnosis not present

## 2022-06-03 DIAGNOSIS — B353 Tinea pedis: Secondary | ICD-10-CM | POA: Diagnosis not present

## 2022-06-03 DIAGNOSIS — M7918 Myalgia, other site: Secondary | ICD-10-CM | POA: Diagnosis not present

## 2022-06-28 DIAGNOSIS — M25511 Pain in right shoulder: Secondary | ICD-10-CM | POA: Diagnosis not present

## 2022-06-29 DIAGNOSIS — M7501 Adhesive capsulitis of right shoulder: Secondary | ICD-10-CM | POA: Diagnosis not present

## 2022-07-01 DIAGNOSIS — J301 Allergic rhinitis due to pollen: Secondary | ICD-10-CM | POA: Diagnosis not present

## 2022-07-01 DIAGNOSIS — J3089 Other allergic rhinitis: Secondary | ICD-10-CM | POA: Diagnosis not present

## 2022-07-01 DIAGNOSIS — L501 Idiopathic urticaria: Secondary | ICD-10-CM | POA: Diagnosis not present

## 2022-07-01 DIAGNOSIS — Z91018 Allergy to other foods: Secondary | ICD-10-CM | POA: Diagnosis not present

## 2022-07-04 DIAGNOSIS — M7501 Adhesive capsulitis of right shoulder: Secondary | ICD-10-CM | POA: Diagnosis not present

## 2022-07-06 DIAGNOSIS — M7501 Adhesive capsulitis of right shoulder: Secondary | ICD-10-CM | POA: Diagnosis not present

## 2022-07-11 DIAGNOSIS — M7501 Adhesive capsulitis of right shoulder: Secondary | ICD-10-CM | POA: Diagnosis not present

## 2022-07-15 ENCOUNTER — Other Ambulatory Visit (HOSPITAL_COMMUNITY): Payer: Self-pay | Admitting: Orthopedic Surgery

## 2022-07-15 DIAGNOSIS — M25511 Pain in right shoulder: Secondary | ICD-10-CM

## 2022-08-09 ENCOUNTER — Ambulatory Visit (HOSPITAL_COMMUNITY)
Admission: RE | Admit: 2022-08-09 | Discharge: 2022-08-09 | Disposition: A | Discharge status: Home / Self Care | Payer: Medicare HMO | Source: Ambulatory Visit | Attending: Orthopedic Surgery | Admitting: Orthopedic Surgery

## 2022-08-09 DIAGNOSIS — M25511 Pain in right shoulder: Secondary | ICD-10-CM | POA: Insufficient documentation

## 2022-08-12 DIAGNOSIS — M25511 Pain in right shoulder: Secondary | ICD-10-CM | POA: Diagnosis not present

## 2022-08-15 DIAGNOSIS — M7501 Adhesive capsulitis of right shoulder: Secondary | ICD-10-CM | POA: Diagnosis not present

## 2022-08-22 DIAGNOSIS — M7501 Adhesive capsulitis of right shoulder: Secondary | ICD-10-CM | POA: Diagnosis not present

## 2022-08-24 DIAGNOSIS — M7501 Adhesive capsulitis of right shoulder: Secondary | ICD-10-CM | POA: Diagnosis not present

## 2022-08-31 NOTE — Progress Notes (Unsigned)
GI Office Note    Referring Provider: Elfredia Nevins, MD Primary Care Physician:  Elfredia Nevins, MD Primary Gastroenterologist: Hennie Duos. Marletta Lor, DO   Date:  09/01/2022  ID:  Gina Osborne, DOB [birth year], MRN [id number]   Chief Complaint   Chief Complaint  Patient presents with   Follow-up    Follow up on GERD and dysphagia   History of Present Illness  Gina Osborne is a 62 y.o. female with a history of diabetes, HTN, thyroid disease, migraines, GERD, and recurrent dysphagia s/p multiple dilations presenting today with complaint of neck swelling.  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 pylori, chronic active gastritis -Advised to avoid triggers for reflux -Continue daily PPI  Colonoscopy 08/16/19: -Tortuous colon -Rectosigmoid, sigmoid, descending diverticulosis -Internal and external hemorrhoids -Repeat colonoscopy in 10 years for surveillance with propofol and ultraslim colonoscope  Last office visit 08/05/21.  Reflux well-controlled on pantoprazole 40 mg daily.  Has had intermittent epigastric discomfort as well as occasional dysphagia.  Advised to schedule EGD with possible dilation and continue PPI daily.  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  Fungal stain positive for rare fungal hyphae -Advised PPI daily and repeat endoscopy as needed for retreatment -Treated with 14-day course of Diflucan  Today:  Noticed some swelling to the left side of her neck near the base. She reports thyroid issues in her family needing thyroidectomy possibly secondary to cancer.  States she had a cousin that  passed away 6 years ago with thyroid cancer.   At night a couple times per week she starts coughing and when she coughs she feels like she is going to vomit but she has a lot of burning when that occurs. Reports some redness to the base of her neck. She has not ben scratching or touching that area frequently. This does not happen every night. Maybe happens about twice per week. This has been occuring for the last 1-2 weeks. Denies any overt dysphagia.   No nausea, lack of appetite, unintentional weight loss.   Current Outpatient Medications  Medication Sig Dispense Refill   atorvastatin (LIPITOR) 10 MG tablet Take 1 tablet by mouth daily.     canagliflozin (INVOKANA) 100 MG TABS tablet Take by mouth daily before breakfast.     Flaxseed, Linseed, (FLAXSEED OIL) 1000 MG CAPS Take by mouth 2 (two) times daily.     glimepiride (AMARYL) 2 MG tablet Take 6 mg by mouth daily with breakfast.     glimepiride (AMARYL) 4 MG tablet Take 1 tablet by mouth 2 (two) times daily.      levothyroxine (SYNTHROID, LEVOTHROID) 50 MCG tablet Take 50 mcg by mouth daily.      lisinopril (PRINIVIL,ZESTRIL) 2.5 MG tablet Take 2.5 mg by mouth daily.      pantoprazole (PROTONIX) 40 MG tablet TAKE ONE TABLET BY MOUTH ONCE DAILY 30 MINUTES  PRIOR TO YOUR FIRST MEAL FOREVER (Patient taking differently: Take 40 mg by mouth daily.) 30 tablet 5   sitaGLIPtin (JANUVIA) 100 MG tablet Take 100 mg by mouth daily.     topiramate (TOPAMAX) 100 MG tablet Take 100 mg by mouth at bedtime.     traZODone (DESYREL) 50 MG tablet Take 150 mg  by mouth at bedtime.      fluconazole (DIFLUCAN) 200 MG tablet Take 2 tablets on day 1 then 1 tablet daily thereafter for a total of 14 days. (Patient not taking: Reported on 09/01/2022) 15 tablet 0   loratadine (CLARITIN) 10 MG tablet Take 1 tablet by mouth daily. (Patient not taking: Reported on 09/01/2022)     phentermine (ADIPEX-P) 37.5 MG tablet Take 37.5 mg by mouth daily before breakfast.  (Patient  not taking: Reported on 09/01/2022)     No current facility-administered medications for this visit.    Past Medical History:  Diagnosis Date   Diabetes mellitus without complication (HCC)    Hypertension    Migraine    Thyroid disease     Past Surgical History:  Procedure Laterality Date   ABDOMINAL HYSTERECTOMY     BALLOON DILATION N/A 09/17/2021   Procedure: BALLOON DILATION;  Surgeon: Lanelle Bal, DO;  [facility];  Service: Endoscopy;  Laterality: N/A;   BIOPSY  09/17/2021   Procedure: BIOPSY;  Surgeon: Lanelle Bal, DO;  [facility];  Service: Endoscopy;;   CHOLECYSTECTOMY     COLONOSCOPY N/A 08/16/2019   Procedure: COLONOSCOPY;  Surgeon: West Bali, MD;  [facility];  Service: Endoscopy;  Laterality: N/A;  1:00pm   ESOPHAGOGASTRODUODENOSCOPY N/A 10/31/2014   [id number] HH/mild non-erosive gastritis/dysphagia due to stricture   ESOPHAGOGASTRODUODENOSCOPY (EGD) WITH PROPOFOL N/A 09/17/2021   Procedure: ESOPHAGOGASTRODUODENOSCOPY (EGD) WITH PROPOFOL;  Surgeon: Lanelle Bal, DO;  [facility];  Service: Endoscopy;  Laterality: N/A;  12:00pm   SAVORY DILATION N/A 10/31/2014   Procedure: SAVORY DILATION;  Surgeon: West Bali, MD;  [facility];  Service: Endoscopy;  Laterality: N/A;    Family History  Problem Relation Age of Onset   Pancreatic cancer Father    Colon cancer Neg Hx     Allergies as of 09/01/2022 - Review Complete 09/01/2022  Allergen Reaction Noted   Darvocet [propoxyphene n-acetaminophen]  08/04/2011    Social History   Socioeconomic History   Marital status: Divorced    Spouse name: Not on file   Number of children: Not on file   Years of education: Not on file   Highest education level: Not on file  Occupational History   Not on file  Tobacco Use   Smoking status: Former   Smokeless tobacco: Never   Tobacco comments:    quit 2004  Substance and Sexual Activity   Alcohol  use: No   Drug use: No   Sexual activity: Yes    Birth control/protection: None  Other Topics Concern   Not on file  Social History Narrative   Not on file   Social Determinants of Health   Financial Resource Strain: Not on file  Food Insecurity: Not on file  Transportation Needs: Not on file  Physical Activity: Not on file  Stress: Not on file  Social Connections: Not on file     Review of Systems   Gen: Denies fever, chills, anorexia. Denies fatigue, weakness, weight loss.  CV: Denies chest pain, palpitations, syncope, peripheral edema, and claudication. Resp: + cough. Denies dyspnea at rest, wheezing, coughing up blood, and pleurisy. GI: See HPI Derm: Denies rash, itching, dry skin Psych: Denies depression, anxiety, memory loss, confusion. No homicidal or suicidal ideation.  Heme: Denies bruising, bleeding, and enlarged lymph nodes.  Physical Exam   BP 113/76   Pulse 72   Temp 97.8 F (  36.6 C)   Ht 5\' 4"  (1.626 m)   Wt 160 lb (72.6 kg)   BMI 27.46 kg/m   General:   Alert and oriented. No distress noted. Pleasant and cooperative.  Head:  Normocephalic and atraumatic. Eyes:  Conjuctiva clear without scleral icterus. Mouth:  Oral mucosa pink and moist. Good dentition. No lesions. Neck: Mild soft tissue swelling to left neck - no firm and is mobile.  Mild patchy erythema to the base of her neck bilaterally. Lungs:  Clear to auscultation bilaterally. No wheezes, rales, or rhonchi. No distress.  Heart:  S1, S2 present without murmurs appreciated.  Abdomen:  +BS, soft, non-tender and non-distended. No rebound or guarding. No HSM or masses noted. Rectal: deferred Msk:  Symmetrical without gross deformities. Normal posture. Extremities:  Without edema. Neurologic:  Alert and  oriented x4 Psych:  Alert and cooperative. Normal mood and affect.   Assessment  Gina Osborne is a 62 y.o. female with a history of diabetes, HTN, thyroid disease, migraines, GERD, and recurrent  dysphagia s/p multiple dilations presenting today with complaint of swelling to her neck.  GERD: Typical reflux symptoms fairly well-controlled with pantoprazole 40 mg once daily.  Does have some occasional burning sensation that occurs once or twice per week although that has not happened at all this week.  She is unsure if this is related to any of her medications or her food choices as she has not been able to identify any specific triggers.  Reinforced GERD diet and lifestyle modifications today.  Advised her that she may use Tums or famotidine as needed for burning.   Swelling to left neck and redness: Reports she has had redness around the base of her neck anteriorly for the last couple of weeks.  She denies any itchiness or pain to the area.  The area appears patchy in nature and is not rough.  She does have some small soft tissue swelling to the base of her left neck that is mobile and soft.  No obvious lymphadenopathy surrounding. No evidence of thyromegaly or thyroid nodules on exam.  She does endorse a family history of thyroid cancer.  As stated above she denies any overt dysphagia or odynophagia but does express issues with an intermittent cough.  She is maintained on Synthroid 50 mcg daily.  Will further evaluate this area with a CT of the soft tissue.  The neck.  PLAN   Continue pantoprazole 40 mg once daily.  Start famotidine 20 mg once nightly as needed. CT soft tissue neck Follow up in 3 months    Brooke Bonito, MSN, FNP-BC, AGACNP-BC [facility]

## 2022-09-01 ENCOUNTER — Encounter: Payer: Self-pay | Admitting: *Deleted

## 2022-09-01 ENCOUNTER — Telehealth: Payer: Self-pay | Admitting: *Deleted

## 2022-09-01 ENCOUNTER — Ambulatory Visit (INDEPENDENT_AMBULATORY_CARE_PROVIDER_SITE_OTHER): Payer: Medicare HMO | Admitting: Gastroenterology

## 2022-09-01 ENCOUNTER — Encounter: Payer: Self-pay | Admitting: Gastroenterology

## 2022-09-01 VITALS — BP 113/76 | HR 72 | Temp 97.8°F | Ht 64.0 in | Wt 160.0 lb

## 2022-09-01 DIAGNOSIS — R221 Localized swelling, mass and lump, neck: Secondary | ICD-10-CM | POA: Diagnosis not present

## 2022-09-01 DIAGNOSIS — K219 Gastro-esophageal reflux disease without esophagitis: Secondary | ICD-10-CM | POA: Diagnosis not present

## 2022-09-01 DIAGNOSIS — R059 Cough, unspecified: Secondary | ICD-10-CM

## 2022-09-01 NOTE — Telephone Encounter (Signed)
PA approved via [employer]. Auth# [id number], DOS: Sep 02 2022 - Oct 02 2022   Called pt and she is aware ct neck appt details

## 2022-09-01 NOTE — Patient Instructions (Signed)
We are ordering a CT of your neck to further evaluate the area of swelling.  There may be a long wait time for this if you want it done at [facility]  Our schedulers will reach out to you with a date and time for the scan.  You could potentially get it done a lot sooner if you are willing to travel to [place], [place], or [employer].  Want you to continue taking pantoprazole 40 mg once daily.  You may use famotidine 20 mg once nightly as needed for burning in your throat.  Follow a GERD diet:  Avoid fried, fatty, greasy, spicy, citrus foods. Avoid caffeine and carbonated beverages. Avoid chocolate. Try eating 4-6 small meals a day rather than 3 large meals. Do not eat within 3 hours of laying down. Prop head of bed up on wood or bricks to create a 6 inch incline.  We will plan to follow-up here in the office in 3 months, sooner if needed.  If you to start developing any trouble swallowing, nausea, vomiting that last more than 24 hours, or pain in your throat please let me know.  It was a pleasure to see you today. I want to create trusting relationships with patients. If you receive a survey regarding your visit,  I greatly appreciate you taking time to fill this out on paper or through your MyChart. I value your feedback.  Brooke Bonito, MSN, FNP-BC, AGACNP-BC [facility]

## 2022-09-06 DIAGNOSIS — R222 Localized swelling, mass and lump, trunk: Secondary | ICD-10-CM | POA: Diagnosis not present

## 2022-09-06 DIAGNOSIS — I10 Essential (primary) hypertension: Secondary | ICD-10-CM | POA: Diagnosis not present

## 2022-09-06 DIAGNOSIS — Z0001 Encounter for general adult medical examination with abnormal findings: Secondary | ICD-10-CM | POA: Diagnosis not present

## 2022-09-06 DIAGNOSIS — E663 Overweight: Secondary | ICD-10-CM | POA: Diagnosis not present

## 2022-09-06 DIAGNOSIS — E559 Vitamin D deficiency, unspecified: Secondary | ICD-10-CM | POA: Diagnosis not present

## 2022-09-06 DIAGNOSIS — E063 Autoimmune thyroiditis: Secondary | ICD-10-CM | POA: Diagnosis not present

## 2022-09-06 DIAGNOSIS — G9332 Myalgic encephalomyelitis/chronic fatigue syndrome: Secondary | ICD-10-CM | POA: Diagnosis not present

## 2022-09-06 DIAGNOSIS — Z6828 Body mass index (BMI) 28.0-28.9, adult: Secondary | ICD-10-CM | POA: Diagnosis not present

## 2022-09-06 DIAGNOSIS — G43909 Migraine, unspecified, not intractable, without status migrainosus: Secondary | ICD-10-CM | POA: Diagnosis not present

## 2022-09-06 DIAGNOSIS — E1165 Type 2 diabetes mellitus with hyperglycemia: Secondary | ICD-10-CM | POA: Diagnosis not present

## 2022-09-06 DIAGNOSIS — Z1331 Encounter for screening for depression: Secondary | ICD-10-CM | POA: Diagnosis not present

## 2022-09-06 DIAGNOSIS — E118 Type 2 diabetes mellitus with unspecified complications: Secondary | ICD-10-CM | POA: Diagnosis not present

## 2022-09-06 DIAGNOSIS — D518 Other vitamin B12 deficiency anemias: Secondary | ICD-10-CM | POA: Diagnosis not present

## 2022-09-16 DIAGNOSIS — M25511 Pain in right shoulder: Secondary | ICD-10-CM | POA: Diagnosis not present

## 2022-09-28 ENCOUNTER — Ambulatory Visit (HOSPITAL_COMMUNITY)
Admission: RE | Admit: 2022-09-28 | Discharge: 2022-09-28 | Disposition: A | Discharge status: Home / Self Care | Payer: Medicare HMO | Source: Ambulatory Visit | Attending: Gastroenterology | Admitting: Gastroenterology

## 2022-09-28 DIAGNOSIS — R221 Localized swelling, mass and lump, neck: Secondary | ICD-10-CM

## 2022-09-28 DIAGNOSIS — R059 Cough, unspecified: Secondary | ICD-10-CM

## 2022-09-28 LAB — POCT I-STAT CREATININE: Creatinine, Ser: 1.2 mg/dL — ABNORMAL HIGH (ref 0.44–1.00)

## 2022-09-28 MED ORDER — IOHEXOL 300 MG/ML  SOLN
75.0000 mL | Freq: Once | INTRAMUSCULAR | Status: AC | PRN
  Administered 2022-09-28: 75 mL via INTRAVENOUS

## 2022-10-03 ENCOUNTER — Telehealth: Payer: Self-pay | Admitting: Gastroenterology

## 2022-10-03 NOTE — Telephone Encounter (Signed)
Pt is adamant about making an appt to come in and get the CT results.... there is an appt for tomorrow but I told her I didn't know if the results were in and I would hate for her to come and the results not be here.

## 2022-10-03 NOTE — Telephone Encounter (Signed)
Patient left a message to receive the results from CT scan she had last week.

## 2022-10-03 NOTE — Telephone Encounter (Signed)
Lmom for pt letting her know that her results are not yet available and that we would call her once they are.

## 2022-10-06 ENCOUNTER — Telehealth (INDEPENDENT_AMBULATORY_CARE_PROVIDER_SITE_OTHER): Payer: Self-pay

## 2022-10-06 NOTE — Telephone Encounter (Signed)
Patient scan from 09/28/2022 has finally been read.

## 2022-10-10 NOTE — Telephone Encounter (Signed)
noted 

## 2022-11-03 ENCOUNTER — Encounter: Payer: Self-pay | Admitting: Internal Medicine

## 2022-12-07 DIAGNOSIS — R222 Localized swelling, mass and lump, trunk: Secondary | ICD-10-CM | POA: Diagnosis not present

## 2022-12-07 DIAGNOSIS — G43909 Migraine, unspecified, not intractable, without status migrainosus: Secondary | ICD-10-CM | POA: Diagnosis not present

## 2022-12-07 DIAGNOSIS — E1165 Type 2 diabetes mellitus with hyperglycemia: Secondary | ICD-10-CM | POA: Diagnosis not present

## 2022-12-07 DIAGNOSIS — E663 Overweight: Secondary | ICD-10-CM | POA: Diagnosis not present

## 2022-12-07 DIAGNOSIS — Z6828 Body mass index (BMI) 28.0-28.9, adult: Secondary | ICD-10-CM | POA: Diagnosis not present

## 2022-12-07 DIAGNOSIS — I10 Essential (primary) hypertension: Secondary | ICD-10-CM | POA: Diagnosis not present

## 2023-01-31 DIAGNOSIS — Z6828 Body mass index (BMI) 28.0-28.9, adult: Secondary | ICD-10-CM | POA: Diagnosis not present

## 2023-01-31 DIAGNOSIS — E1165 Type 2 diabetes mellitus with hyperglycemia: Secondary | ICD-10-CM | POA: Diagnosis not present

## 2023-01-31 DIAGNOSIS — I10 Essential (primary) hypertension: Secondary | ICD-10-CM | POA: Diagnosis not present

## 2023-01-31 DIAGNOSIS — E663 Overweight: Secondary | ICD-10-CM | POA: Diagnosis not present

## 2023-01-31 DIAGNOSIS — K219 Gastro-esophageal reflux disease without esophagitis: Secondary | ICD-10-CM | POA: Diagnosis not present

## 2023-03-22 DIAGNOSIS — I10 Essential (primary) hypertension: Secondary | ICD-10-CM | POA: Diagnosis not present

## 2023-03-22 DIAGNOSIS — G43909 Migraine, unspecified, not intractable, without status migrainosus: Secondary | ICD-10-CM | POA: Diagnosis not present

## 2023-03-22 DIAGNOSIS — J45909 Unspecified asthma, uncomplicated: Secondary | ICD-10-CM | POA: Diagnosis not present

## 2023-03-22 DIAGNOSIS — E6609 Other obesity due to excess calories: Secondary | ICD-10-CM | POA: Diagnosis not present

## 2023-03-22 DIAGNOSIS — Z6828 Body mass index (BMI) 28.0-28.9, adult: Secondary | ICD-10-CM | POA: Diagnosis not present

## 2023-03-22 DIAGNOSIS — E1165 Type 2 diabetes mellitus with hyperglycemia: Secondary | ICD-10-CM | POA: Diagnosis not present

## 2023-06-16 DIAGNOSIS — Z91018 Allergy to other foods: Secondary | ICD-10-CM | POA: Diagnosis not present

## 2023-06-16 DIAGNOSIS — J301 Allergic rhinitis due to pollen: Secondary | ICD-10-CM | POA: Diagnosis not present

## 2023-06-16 DIAGNOSIS — J3089 Other allergic rhinitis: Secondary | ICD-10-CM | POA: Diagnosis not present

## 2023-06-16 DIAGNOSIS — L501 Idiopathic urticaria: Secondary | ICD-10-CM | POA: Diagnosis not present

## 2023-06-22 DIAGNOSIS — D518 Other vitamin B12 deficiency anemias: Secondary | ICD-10-CM | POA: Diagnosis not present

## 2023-06-22 DIAGNOSIS — B353 Tinea pedis: Secondary | ICD-10-CM | POA: Diagnosis not present

## 2023-06-22 DIAGNOSIS — M7918 Myalgia, other site: Secondary | ICD-10-CM | POA: Diagnosis not present

## 2023-06-22 DIAGNOSIS — G43909 Migraine, unspecified, not intractable, without status migrainosus: Secondary | ICD-10-CM | POA: Diagnosis not present

## 2023-06-22 DIAGNOSIS — J45909 Unspecified asthma, uncomplicated: Secondary | ICD-10-CM | POA: Diagnosis not present

## 2023-06-22 DIAGNOSIS — I10 Essential (primary) hypertension: Secondary | ICD-10-CM | POA: Diagnosis not present

## 2023-06-22 DIAGNOSIS — R222 Localized swelling, mass and lump, trunk: Secondary | ICD-10-CM | POA: Diagnosis not present

## 2023-06-22 DIAGNOSIS — E1165 Type 2 diabetes mellitus with hyperglycemia: Secondary | ICD-10-CM | POA: Diagnosis not present

## 2023-06-22 DIAGNOSIS — E559 Vitamin D deficiency, unspecified: Secondary | ICD-10-CM | POA: Diagnosis not present

## 2023-08-25 ENCOUNTER — Ambulatory Visit

## 2023-08-25 ENCOUNTER — Ambulatory Visit
Admission: EM | Admit: 2023-08-25 | Discharge: 2023-08-25 | Disposition: A | Discharge status: Home / Self Care | Attending: Family Medicine | Admitting: Family Medicine

## 2023-08-25 ENCOUNTER — Ambulatory Visit: Payer: Self-pay | Admitting: Family Medicine

## 2023-08-25 DIAGNOSIS — J22 Unspecified acute lower respiratory infection: Secondary | ICD-10-CM | POA: Diagnosis not present

## 2023-08-25 DIAGNOSIS — R000 Tachycardia, unspecified: Secondary | ICD-10-CM | POA: Diagnosis not present

## 2023-08-25 DIAGNOSIS — R051 Acute cough: Secondary | ICD-10-CM | POA: Diagnosis not present

## 2023-08-25 DIAGNOSIS — I959 Hypotension, unspecified: Secondary | ICD-10-CM

## 2023-08-25 DIAGNOSIS — R059 Cough, unspecified: Secondary | ICD-10-CM | POA: Diagnosis not present

## 2023-08-25 DIAGNOSIS — R918 Other nonspecific abnormal finding of lung field: Secondary | ICD-10-CM | POA: Diagnosis not present

## 2023-08-25 DIAGNOSIS — R509 Fever, unspecified: Secondary | ICD-10-CM | POA: Diagnosis not present

## 2023-08-25 MED ORDER — PREDNISONE 20 MG PO TABS: 40.0000 mg | ORAL_TABLET | Freq: Every day | ORAL | 0 refills | Status: DC

## 2023-08-25 MED ORDER — DOXYCYCLINE HYCLATE 100 MG PO CAPS: 100.0000 mg | ORAL_CAPSULE | Freq: Two times a day (BID) | ORAL | 0 refills | Status: DC

## 2023-08-25 MED ORDER — ALBUTEROL SULFATE HFA 108 (90 BASE) MCG/ACT IN AERS: 2.0000 | INHALATION_SPRAY | RESPIRATORY_TRACT | 0 refills | Status: AC | PRN

## 2023-08-25 NOTE — ED Triage Notes (Signed)
 Pt reports she has a cough, sore throat, lost her voice x 4 days   Took nyquil and dayquil

## 2023-08-25 NOTE — Discharge Instructions (Signed)
 We will let you know if the radiologist finds any abnormal findings on your chest x-ray today.  As we discussed, your blood pressure is on the low side so I would like you to hold your lisinopril for the next few days and continue monitoring your blood pressures closely at home, writing down your readings for review by your primary care provider first thing Monday.  If your symptoms worsen at any time despite the medications prescribed go directly to the emergency department.

## 2023-08-25 NOTE — ED Provider Notes (Signed)
 [facility]    CSN: [id number] Arrival date & time: 08/25/23  1229      History   Chief Complaint No chief complaint on file.   HPI Gina Osborne is a 63 y.o. female.   Patient presenting today with 4-day history of progressively worsening cough, sore throat, hoarseness, chest tightness, chest pain with coughing, fatigue.  Has had some intermittent fevers as well.  States she slept for the past 2 days straight.  Denies severe shortness of breath, wheezing, abdominal pain, vomiting, diarrhea but has had no appetite.  Tolerating p.o. fluids but no solids.  Tried some DayQuil and NyQuil several days ago with minimal relief.  No known sick contacts or known chronic pulmonary disease.    Past Medical History:  Diagnosis Date   Diabetes mellitus without complication (HCC)    Hypertension    Migraine    Thyroid  disease     Patient Active Problem List   Diagnosis Date Noted   GERD (gastroesophageal reflux disease) 12/17/2015   Colon cancer screening 12/17/2015   Gastritis 12/30/2014   Dysphagia 09/29/2014    Past Surgical History:  Procedure Laterality Date   ABDOMINAL HYSTERECTOMY     BALLOON DILATION N/A 09/17/2021   Procedure: BALLOON DILATION;  Surgeon: Vinetta Greening, DO;  [facility]: [facility];  Service: Endoscopy;  Laterality: N/A;   BIOPSY  09/17/2021   Procedure: BIOPSY;  Surgeon: Vinetta Greening, DO;  Location: [facility];  Service: Endoscopy;;   CHOLECYSTECTOMY     COLONOSCOPY N/A 08/16/2019   Procedure: COLONOSCOPY;  Surgeon: Alyce Jubilee, MD;  Location: [facility];  Service: Endoscopy;  Laterality: N/A;  1:00pm   ESOPHAGOGASTRODUODENOSCOPY N/A 10/31/2014   WUJ:WJXBJ HH/mild non-erosive gastritis/dysphagia due to stricture   ESOPHAGOGASTRODUODENOSCOPY (EGD) WITH PROPOFOL  N/A 09/17/2021   Procedure: ESOPHAGOGASTRODUODENOSCOPY (EGD) WITH PROPOFOL ;  Surgeon: Vinetta Greening, DO;  Location: [facility];  Service: Endoscopy;  Laterality: N/A;   12:00pm   SAVORY DILATION N/A 10/31/2014   Procedure: SAVORY DILATION;  Surgeon: Alyce Jubilee, MD;  Location: [facility];  Service: Endoscopy;  Laterality: N/A;    OB History   No obstetric history on file.      Home Medications    Prior to Admission medications   Medication Sig Start Date End Date Taking? Authorizing Provider  albuterol (VENTOLIN HFA) 108 (90 Base) MCG/ACT inhaler Inhale 2 puffs into the lungs every 4 (four) hours as needed. 08/25/23  Yes Corbin Dess, PA-C  doxycycline (VIBRAMYCIN) 100 MG capsule Take 1 capsule (100 mg total) by mouth 2 (two) times daily. 08/25/23  Yes Corbin Dess, PA-C  predniSONE (DELTASONE) 20 MG tablet Take 2 tablets (40 mg total) by mouth daily with breakfast. 08/25/23  Yes Corbin Dess, PA-C  atorvastatin (LIPITOR) 10 MG tablet Take 1 tablet by mouth daily. 04/22/19   [provider]  canagliflozin (INVOKANA) 100 MG TABS tablet Take by mouth daily before breakfast.    [provider]  Flaxseed, Linseed, (FLAXSEED OIL) 1000 MG CAPS Take by mouth 2 (two) times daily.    [provider]  glimepiride (AMARYL) 4 MG tablet Take 1 tablet by mouth 2 (two) times daily.  05/28/19   [provider]  levocetirizine (XYZAL) 5 MG tablet 1 tablet in the evening Orally Once a day for 30 days 06/05/20   [provider]  levothyroxine (SYNTHROID, LEVOTHROID) 50 MCG tablet Take 50 mcg by mouth daily.  [provider]  lisinopril (PRINIVIL,ZESTRIL) 2.5 MG tablet Take 2.5 mg by mouth daily.  08/28/14   [provider]  meloxicam (MOBIC) 15 MG tablet Take 15 mg by mouth daily. 08/12/22   [provider]  pantoprazole  (PROTONIX ) 40 MG tablet TAKE ONE TABLET BY MOUTH ONCE DAILY 30 MINUTES  PRIOR TO YOUR FIRST MEAL FOREVER Patient taking differently: Take 40 mg by mouth daily. 11/16/15   Iola Manila, NP  sitaGLIPtin (JANUVIA) 100 MG tablet Take 100 mg by mouth daily.     [provider]  topiramate (TOPAMAX) 100 MG tablet Take 100 mg by mouth at bedtime.    [provider]  traZODone (DESYREL) 50 MG tablet Take 150 mg by mouth at bedtime.     [provider]    Family History Family History  Problem Relation Age of Onset   Pancreatic cancer Father    Thyroid  cancer Cousin    Colon cancer Neg Hx     Social History Social History   Tobacco Use   Smoking status: Former   Smokeless tobacco: Never   Tobacco comments:    quit 2004  Substance Use Topics   Alcohol use: No   Drug use: No     Allergies   Darvocet [propoxyphene n-acetaminophen ]   Review of Systems Review of Systems Per HPI  Physical Exam Triage Vital Signs ED Triage Vitals  Encounter Vitals Group     BP 08/25/23 1247 (!) 81/60     Systolic BP Percentile --      Diastolic BP Percentile --      Pulse Rate 08/25/23 1247 100     Resp 08/25/23 1247 18     Temp 08/25/23 1247 98.7 F (37.1 C)     Temp Source 08/25/23 1247 Oral     SpO2 08/25/23 1247 95 %     Weight --      Height --      Head Circumference --      Peak Flow --      Pain Score 08/25/23 1258 0     Pain Loc --      Pain Education --      Exclude from Growth Chart --    No data found.  Updated Vital Signs BP 92/70 (BP Location: Right Arm)   Pulse 99   Temp 98.7 F (37.1 C) (Oral)   Resp 18   SpO2 92%   Visual Acuity Right Eye Distance:   Left Eye Distance:   Bilateral Distance:    Right Eye Near:   Left Eye Near:    Bilateral Near:     Physical Exam Vitals and nursing note reviewed.  Constitutional:      Appearance: Normal appearance.  HENT:     Head: Atraumatic.     Right Ear: Tympanic membrane and external ear normal.     Left Ear: Tympanic membrane and external ear normal.     Nose: Congestion present.     Mouth/Throat:     Mouth: Mucous membranes are moist.     Pharynx: Posterior oropharyngeal erythema present.  Eyes:     Extraocular Movements:  Extraocular movements intact.     Conjunctiva/sclera: Conjunctivae normal.  Cardiovascular:     Rate and Rhythm: Normal rate and regular rhythm.     Heart sounds: Normal heart sounds.  Pulmonary:     Effort: Pulmonary effort is normal.     Breath sounds: Normal breath sounds. No wheezing or rales.  Musculoskeletal:        General: Normal range of motion.     Cervical back: Normal range of motion and neck supple.  Skin:    General: Skin is warm and dry.  Neurological:     Mental Status: She is alert and oriented to person, place, and time.  Psychiatric:        Mood and Affect: Mood normal.        Thought Content: Thought content normal.      UC Treatments / Results  Labs (all labs ordered are listed, but only abnormal results are displayed) Labs Reviewed - No data to display  EKG   Radiology DG Chest 2 View Result Date: 08/25/2023 CLINICAL DATA:  Cough. EXAM: CHEST - 2 VIEW COMPARISON:  07/01/2016. FINDINGS: The heart size and mediastinal contours are within normal limits. Mild bilateral peribronchial cuffing and interstitial prominence, which can be seen in the setting of bronchitis. No focal consolidation, pleural effusion, or pneumothorax. No acute osseous abnormality. IMPRESSION: Bilateral peribronchial cuffing and interstitial prominence, which can be seen in the setting of bronchitis. No focal consolidation. Electronically Signed   By: Mannie Seek M.D.   On: 08/25/2023 13:45    Procedures Procedures (including critical care time)  Medications Ordered in UC Medications - No data to display  Initial Impression / Assessment and Plan / UC Course  I have reviewed the triage vital signs and the nursing notes.  Pertinent labs & imaging results that were available during my care of the patient were reviewed by me and considered in my medical decision making (see chart for details).     Borderline tachycardic and hypotensive in triage, oxygen saturation 92% on room  air.  She is overall well-appearing and in no acute distress, speaking in full sentences and breathing comfortably.  Chest x-ray today not showing any obvious pneumonia but given worsening course of symptoms and borderline vital signs will cover for a possible developing lower respiratory infection with doxycycline, prednisone, albuterol inhaler.  I did recommend given her borderline vital signs further evaluation in the emergency department but she declines at this time.  She does have a significant other who is able to stay with her over the weekend and keep an eye on her and she knows to go to the emergency department at any point that her symptoms are worsening.  Final Clinical Impressions(s) / UC Diagnoses   Final diagnoses:  Acute cough  Lower respiratory infection  Tachycardia  Hypotension, unspecified hypotension type  Fever, unspecified     Discharge Instructions      We will let you know if the radiologist finds any abnormal findings on your chest x-ray today.  As we discussed, your blood pressure is on the low side so I would like you to hold your lisinopril for the next few days and continue monitoring your blood pressures closely at home, writing down your readings for review by your primary care provider first thing Monday.  If your symptoms worsen at any time despite the medications prescribed go directly to the emergency department.  ED Prescriptions     Medication Sig Dispense Auth. Provider   doxycycline (VIBRAMYCIN) 100 MG capsule Take 1 capsule (100 mg total) by mouth 2 (two) times daily. 14 capsule Corbin Dess, PA-C   predniSONE (DELTASONE) 20 MG tablet Take 2 tablets (40 mg total) by mouth daily with breakfast. 10 tablet Corbin Dess, PA-C   albuterol (VENTOLIN HFA) 108 (90 Base) MCG/ACT inhaler Inhale  2 puffs into the lungs every 4 (four) hours as needed. 18 g Corbin Dess, [place]      PDMP not reviewed this encounter.   Corbin Dess, [place] 08/25/23 [phone]

## 2023-09-01 DIAGNOSIS — Z6828 Body mass index (BMI) 28.0-28.9, adult: Secondary | ICD-10-CM | POA: Diagnosis not present

## 2023-09-01 DIAGNOSIS — E663 Overweight: Secondary | ICD-10-CM | POA: Diagnosis not present

## 2023-09-01 DIAGNOSIS — R432 Parageusia: Secondary | ICD-10-CM | POA: Diagnosis not present

## 2023-09-01 DIAGNOSIS — G9332 Myalgic encephalomyelitis/chronic fatigue syndrome: Secondary | ICD-10-CM | POA: Diagnosis not present

## 2023-09-01 DIAGNOSIS — E1165 Type 2 diabetes mellitus with hyperglycemia: Secondary | ICD-10-CM | POA: Diagnosis not present

## 2023-09-21 ENCOUNTER — Ambulatory Visit (HOSPITAL_COMMUNITY)
Admission: RE | Admit: 2023-09-21 | Discharge: 2023-09-21 | Disposition: A | Discharge status: Home / Self Care | Source: Ambulatory Visit | Attending: Internal Medicine | Admitting: Internal Medicine

## 2023-09-21 ENCOUNTER — Other Ambulatory Visit (HOSPITAL_COMMUNITY): Payer: Self-pay | Admitting: Internal Medicine

## 2023-09-21 DIAGNOSIS — E1165 Type 2 diabetes mellitus with hyperglycemia: Secondary | ICD-10-CM | POA: Diagnosis not present

## 2023-09-21 DIAGNOSIS — R059 Cough, unspecified: Secondary | ICD-10-CM

## 2023-09-21 DIAGNOSIS — J45901 Unspecified asthma with (acute) exacerbation: Secondary | ICD-10-CM | POA: Diagnosis not present

## 2023-09-21 DIAGNOSIS — Z6828 Body mass index (BMI) 28.0-28.9, adult: Secondary | ICD-10-CM | POA: Diagnosis not present

## 2023-09-21 DIAGNOSIS — K219 Gastro-esophageal reflux disease without esophagitis: Secondary | ICD-10-CM | POA: Diagnosis not present

## 2023-09-21 DIAGNOSIS — J45909 Unspecified asthma, uncomplicated: Secondary | ICD-10-CM | POA: Diagnosis not present

## 2023-09-21 DIAGNOSIS — I10 Essential (primary) hypertension: Secondary | ICD-10-CM | POA: Diagnosis not present

## 2023-09-21 DIAGNOSIS — E663 Overweight: Secondary | ICD-10-CM | POA: Diagnosis not present

## 2023-09-27 DIAGNOSIS — Z9229 Personal history of other drug therapy: Secondary | ICD-10-CM | POA: Diagnosis not present

## 2023-09-27 DIAGNOSIS — I10 Essential (primary) hypertension: Secondary | ICD-10-CM | POA: Diagnosis not present

## 2023-09-27 DIAGNOSIS — E1165 Type 2 diabetes mellitus with hyperglycemia: Secondary | ICD-10-CM | POA: Diagnosis not present

## 2023-11-08 DIAGNOSIS — H25812 Combined forms of age-related cataract, left eye: Secondary | ICD-10-CM | POA: Diagnosis not present

## 2023-11-08 DIAGNOSIS — H2511 Age-related nuclear cataract, right eye: Secondary | ICD-10-CM | POA: Diagnosis not present

## 2023-11-08 DIAGNOSIS — E119 Type 2 diabetes mellitus without complications: Secondary | ICD-10-CM | POA: Diagnosis not present

## 2024-01-05 DIAGNOSIS — J301 Allergic rhinitis due to pollen: Secondary | ICD-10-CM | POA: Diagnosis not present

## 2024-01-05 DIAGNOSIS — J3089 Other allergic rhinitis: Secondary | ICD-10-CM | POA: Diagnosis not present

## 2024-01-05 DIAGNOSIS — L501 Idiopathic urticaria: Secondary | ICD-10-CM | POA: Diagnosis not present

## 2024-01-05 DIAGNOSIS — Z91018 Allergy to other foods: Secondary | ICD-10-CM | POA: Diagnosis not present

## 2024-02-16 ENCOUNTER — Other Ambulatory Visit: Payer: Self-pay

## 2024-02-16 NOTE — Telephone Encounter (Signed)
 Copied from CRM #[id number]. Topic: Clinical - Medication Refill >> Feb 16, 2024  4:25 PM Victoria B wrote: Medication: traZODone (DESYREL) 50 MG tablet/glimepiride (AMARYL) 4 MG tablet Patients previous Dr retired and needs these meds refilled  Has the patient contacted their pharmacy? no  This is the patient's preferred pharmacy:  [facility] [address] [place] - [address] [place] [address] Phone: [phone] Fax: [phone]  Is this the correct pharmacy for this prescription? yes Has the prescription been filled recently? no  Is the patient out of the medication? yes  Has the patient been seen for an appointment in the last year OR does the patient have an upcoming appointment? yes  Can we respond through MyChart? yes  Agent: Please be advised that Rx refills may take up to 3 business days. We ask that you follow-up with your pharmacy.

## 2024-03-20 ENCOUNTER — Other Ambulatory Visit: Payer: Self-pay | Admitting: Gastroenterology

## 2024-03-20 MED ORDER — PANTOPRAZOLE SODIUM 40 MG PO TBEC: 40.0000 mg | DELAYED_RELEASE_TABLET | Freq: Every day | ORAL | 2 refills | Status: AC

## 2024-04-02 ENCOUNTER — Ambulatory Visit

## 2024-04-02 VITALS — BP 111/83 | HR 100 | Temp 97.8°F | Ht 64.0 in | Wt 149.0 lb

## 2024-04-02 DIAGNOSIS — G4700 Insomnia, unspecified: Secondary | ICD-10-CM

## 2024-04-02 DIAGNOSIS — K219 Gastro-esophageal reflux disease without esophagitis: Secondary | ICD-10-CM

## 2024-04-02 DIAGNOSIS — E785 Hyperlipidemia, unspecified: Secondary | ICD-10-CM

## 2024-04-02 DIAGNOSIS — Z0000 Encounter for general adult medical examination without abnormal findings: Secondary | ICD-10-CM

## 2024-04-02 DIAGNOSIS — I152 Hypertension secondary to endocrine disorders: Secondary | ICD-10-CM

## 2024-04-02 DIAGNOSIS — Z7689 Persons encountering health services in other specified circumstances: Secondary | ICD-10-CM | POA: Diagnosis not present

## 2024-04-02 DIAGNOSIS — J3089 Other allergic rhinitis: Secondary | ICD-10-CM

## 2024-04-02 DIAGNOSIS — G43009 Migraine without aura, not intractable, without status migrainosus: Secondary | ICD-10-CM | POA: Diagnosis not present

## 2024-04-02 DIAGNOSIS — E119 Type 2 diabetes mellitus without complications: Secondary | ICD-10-CM

## 2024-04-02 DIAGNOSIS — E1169 Type 2 diabetes mellitus with other specified complication: Secondary | ICD-10-CM

## 2024-04-02 DIAGNOSIS — E1159 Type 2 diabetes mellitus with other circulatory complications: Secondary | ICD-10-CM | POA: Diagnosis not present

## 2024-04-02 DIAGNOSIS — E039 Hypothyroidism, unspecified: Secondary | ICD-10-CM

## 2024-04-02 DIAGNOSIS — Z1321 Encounter for screening for nutritional disorder: Secondary | ICD-10-CM

## 2024-04-02 LAB — POCT UA - MICROALBUMIN
Creatinine, POC: 50 mg/dL
Microalbumin Ur, POC: 30 mg/L

## 2024-04-02 LAB — POCT GLYCOSYLATED HEMOGLOBIN (HGB A1C): Hemoglobin A1C: 8.5 % — AB (ref 4.0–5.6)

## 2024-04-02 MED ORDER — GLIMEPIRIDE 4 MG PO TABS: 4.0000 mg | ORAL_TABLET | Freq: Two times a day (BID) | ORAL | 2 refills | Status: AC

## 2024-04-02 MED ORDER — LEVOCETIRIZINE DIHYDROCHLORIDE 5 MG PO TABS: 5.0000 mg | ORAL_TABLET | Freq: Every evening | ORAL | 2 refills | Status: AC

## 2024-04-02 MED ORDER — INVOKANA 100 MG PO TABS: 100.0000 mg | ORAL_TABLET | Freq: Every day | ORAL | 2 refills | Status: AC

## 2024-04-02 MED ORDER — ATORVASTATIN CALCIUM 10 MG PO TABS: 10.0000 mg | ORAL_TABLET | Freq: Every day | ORAL | 2 refills | Status: AC

## 2024-04-02 MED ORDER — LISINOPRIL 2.5 MG PO TABS: 2.5000 mg | ORAL_TABLET | Freq: Every day | ORAL | 2 refills | Status: AC

## 2024-04-02 MED ORDER — SITAGLIPTIN PHOSPHATE 100 MG PO TABS: 100.0000 mg | ORAL_TABLET | Freq: Every day | ORAL | 2 refills | Status: AC

## 2024-04-02 MED ORDER — LEVOTHYROXINE SODIUM 50 MCG PO TABS: 50.0000 ug | ORAL_TABLET | Freq: Every day | ORAL | 2 refills | Status: AC

## 2024-04-02 MED ORDER — TOPIRAMATE 100 MG PO TABS: 100.0000 mg | ORAL_TABLET | Freq: Every day | ORAL | 2 refills | Status: AC

## 2024-04-02 MED ORDER — TRAZODONE HCL 50 MG PO TABS: 100.0000 mg | ORAL_TABLET | Freq: Every day | ORAL | 2 refills | Status: AC

## 2024-04-02 NOTE — Patient Instructions (Signed)
 VISIT SUMMARY: Today, we addressed your medication refills and management of your chronic conditions, including diabetes, hypothyroidism, hypertension, hyperlipidemia, migraines, insomnia, and gastroesophageal reflux disease. We also discussed general health maintenance and necessary vaccinations.  YOUR PLAN: TYPE 2 DIABETES MELLITUS: Your blood sugar levels have been higher than desired due to not taking your medications regularly. -Refilled your prescriptions for glimepiride , sitagliptin , and Invokana . -Ordered an A1c test to re-evaluate your blood sugar control. -Please resume taking your diabetes medications as prescribed.  HYPOTHYROIDISM: You have low thyroid  function and have not been taking your medication regularly. -Ordered thyroid  function tests including TSH and other thyroid  hormones. -Please resume taking your levothyroxine  as prescribed.  HYPERTENSION: Your blood pressure is well-controlled with your current medication. -Refilled your prescription for lisinopril .  HYPERLIPIDEMIA: You have high cholesterol levels. -Ordered a lipid panel to check your cholesterol levels.  MIGRAINE: You have migraines that are managed with medication. -Refilled your prescription for Topamax .  INSOMNIA: You have difficulty falling asleep and are taking trazodone  to help with sleep. -Refilled your prescription for trazodone .  DYSPHAGIA AND GASTROESOPHAGEAL REFLUX DISEASE: You have difficulty swallowing and acid reflux issues. -Consulted with your gastroenterologist regarding a barium swallow study. -Advised to eat smaller, more frequent meals.  GENERAL HEALTH MAINTENANCE: You are due for some vaccinations and screenings. -Please obtain shingles and tetanus vaccines at the pharmacy. -Schedule a mammogram. -Obtain the last dose of the pneumonia vaccine at the pharmacy.  If you have any problems before your next visit feel free to message me via MyChart (minor issues or questions) or call the  office, otherwise you may reach out to schedule an office visit.  Thank you! Saddie Sacks, PA-C

## 2024-04-03 LAB — CBC WITH DIFFERENTIAL/PLATELET
Basophils Absolute: 0.1 x10E3/uL (ref 0.0–0.2)
Basos: 1 %
EOS (ABSOLUTE): 0.2 x10E3/uL (ref 0.0–0.4)
Eos: 2 %
Hematocrit: 49.8 % — ABNORMAL HIGH (ref 34.0–46.6)
Hemoglobin: 16.8 g/dL — ABNORMAL HIGH (ref 11.1–15.9)
Immature Grans (Abs): 0.1 x10E3/uL (ref 0.0–0.1)
Immature Granulocytes: 1 %
Lymphocytes Absolute: 2.6 x10E3/uL (ref 0.7–3.1)
Lymphs: 26 %
MCH: 30.5 pg (ref 26.6–33.0)
MCHC: 33.7 g/dL (ref 31.5–35.7)
MCV: 90 fL (ref 79–97)
Monocytes Absolute: 0.7 x10E3/uL (ref 0.1–0.9)
Monocytes: 7 %
Neutrophils Absolute: 6.6 x10E3/uL (ref 1.4–7.0)
Neutrophils: 63 %
Platelets: 264 x10E3/uL (ref 150–450)
RBC: 5.51 x10E6/uL — ABNORMAL HIGH (ref 3.77–5.28)
RDW: 14.4 % (ref 11.7–15.4)
WBC: 10.3 x10E3/uL (ref 3.4–10.8)

## 2024-04-03 LAB — THYROID PANEL WITH TSH
Free Thyroxine Index: 2.8 (ref 1.2–4.9)
T3 Uptake Ratio: 23 % — ABNORMAL LOW (ref 24–39)
T4, Total: 12 ug/dL (ref 4.5–12.0)
TSH: 2.21 u[IU]/mL (ref 0.450–4.500)

## 2024-04-03 LAB — COMPREHENSIVE METABOLIC PANEL WITH GFR
ALT: 12 IU/L (ref 0–32)
AST: 13 IU/L (ref 0–40)
Albumin: 4.4 g/dL (ref 3.9–4.9)
Alkaline Phosphatase: 154 IU/L — ABNORMAL HIGH (ref 49–135)
BUN/Creatinine Ratio: 6 — ABNORMAL LOW (ref 12–28)
BUN: 6 mg/dL — ABNORMAL LOW (ref 8–27)
Bilirubin Total: 0.7 mg/dL (ref 0.0–1.2)
CO2: 23 mmol/L (ref 20–29)
Calcium: 10 mg/dL (ref 8.7–10.3)
Chloride: 100 mmol/L (ref 96–106)
Creatinine, Ser: 1.07 mg/dL — ABNORMAL HIGH (ref 0.57–1.00)
Globulin, Total: 2.9 g/dL (ref 1.5–4.5)
Glucose: 262 mg/dL — ABNORMAL HIGH (ref 70–99)
Potassium: 3.6 mmol/L (ref 3.5–5.2)
Sodium: 137 mmol/L (ref 134–144)
Total Protein: 7.3 g/dL (ref 6.0–8.5)
eGFR: 59 mL/min/1.73 — ABNORMAL LOW

## 2024-04-03 LAB — VITAMIN D 25 HYDROXY (VIT D DEFICIENCY, FRACTURES): Vit D, 25-Hydroxy: 42.8 ng/mL (ref 30.0–100.0)

## 2024-04-03 LAB — LIPID PANEL
Chol/HDL Ratio: 4.1 ratio (ref 0.0–4.4)
Cholesterol, Total: 205 mg/dL — ABNORMAL HIGH (ref 100–199)
HDL: 50 mg/dL
LDL Chol Calc (NIH): 131 mg/dL — ABNORMAL HIGH (ref 0–99)
Triglycerides: 137 mg/dL (ref 0–149)
VLDL Cholesterol Cal: 24 mg/dL (ref 5–40)

## 2024-04-05 ENCOUNTER — Ambulatory Visit: Payer: Self-pay

## 2024-04-05 DIAGNOSIS — I152 Hypertension secondary to endocrine disorders: Secondary | ICD-10-CM

## 2024-04-05 DIAGNOSIS — E119 Type 2 diabetes mellitus without complications: Secondary | ICD-10-CM

## 2024-04-05 DIAGNOSIS — E1169 Type 2 diabetes mellitus with other specified complication: Secondary | ICD-10-CM

## 2024-04-05 DIAGNOSIS — R7989 Other specified abnormal findings of blood chemistry: Secondary | ICD-10-CM

## 2024-04-08 ENCOUNTER — Ambulatory Visit

## 2024-04-08 DIAGNOSIS — Z0000 Encounter for general adult medical examination without abnormal findings: Secondary | ICD-10-CM | POA: Insufficient documentation

## 2024-04-08 DIAGNOSIS — I152 Hypertension secondary to endocrine disorders: Secondary | ICD-10-CM | POA: Insufficient documentation

## 2024-04-08 DIAGNOSIS — G43909 Migraine, unspecified, not intractable, without status migrainosus: Secondary | ICD-10-CM | POA: Insufficient documentation

## 2024-04-08 DIAGNOSIS — E119 Type 2 diabetes mellitus without complications: Secondary | ICD-10-CM | POA: Insufficient documentation

## 2024-04-08 DIAGNOSIS — E039 Hypothyroidism, unspecified: Secondary | ICD-10-CM | POA: Insufficient documentation

## 2024-04-08 DIAGNOSIS — G4700 Insomnia, unspecified: Secondary | ICD-10-CM | POA: Insufficient documentation

## 2024-04-08 NOTE — Telephone Encounter (Signed)
 Called and LVM; sent patient a MyChart message. Need to schedule lab appointment for March.

## 2024-04-08 NOTE — Assessment & Plan Note (Signed)
 Difficulty falling asleep, managed with trazodone . - Refilled trazodone .

## 2024-04-08 NOTE — Assessment & Plan Note (Signed)
 On levothyroxine . No recent thyroid  function tests. Will update today  - Ordered thyroid  function tests including TSH and other thyroid  hormones. - Continue levothyroxine  50 mcg daily

## 2024-04-08 NOTE — Assessment & Plan Note (Signed)
 Managed with Topamax  daily for prevention, effective.  - Refilled Topamax .

## 2024-04-08 NOTE — Assessment & Plan Note (Addendum)
 A1c elevated at 8.2 due to running out of medication. No medication changes until A1c re-evaluated. - Refilled glimepiride , sitagliptin , and Invokana . - Advised to resume medication adherence.

## 2024-04-08 NOTE — Assessment & Plan Note (Signed)
 BP Goal < 130/80. BP stable and at goal. Continue lisinopril  2.5 mg daily. Collecting CMP with labs.

## 2024-04-08 NOTE — Assessment & Plan Note (Signed)
 Difficulty swallowing, possibly due to reflux and esophageal issues. Previous EGDs and throat stretching performed. - Advised to keep follow up with gastroenterologist regarding barium swallow study. - Advised smaller, more frequent meals for better digestion and less side effects.

## 2024-04-08 NOTE — Progress Notes (Signed)
 "  New Patient Office Visit  Subjective    Patient ID: Gina Osborne, female    DOB: 1960-12-20  Age: 63 y.o. MRN: [id number]  CC:  Chief Complaint  Patient presents with   New Patient (Initial Visit)    Discussed the use of AI scribe software for clinical note transcription with the patient, who gave verbal consent to proceed.  History of Present Illness   Gina Osborne is a 63 year old female who presents for medication refills and management of her chronic conditions. She is accompanied by her daughter, Gina Osborne. She is establishing here today solely for medication refills but has plans to transfer care to a [facility] closer to home.   Diabetes mellitus - Recent lapse in diabetes medication use - Most recent hemoglobin A1c was 8.2% - Previously followed every three months for diabetes management - Experiences appetite changes and nausea  Hypothyroidism - Currently on levothyroxine , but recently without medication - No history of thyroid  surgery  Hypertension - Currently taking lisinopril  for blood pressure control - Not checking at home  - Denies CP, SOB, edema, HA, vision changes, or symptoms of hypotension   Hyperlipidemia - On medication for hypercholesterolemia  Gastroesophageal reflux disease and dysphagia - History of acid reflux under care of gastroenterologist - Underwent multiple EGDs and two esophageal dilations - Reports that every time she drinks water , it results in vomiting - No barium swallow study performed  Migraine headaches - On medication for migraine management  Sleep disturbance - Takes trazodone  at bedtime - Difficulty falling asleep but trazodone  is effective   Surgical history - History of cholecystectomy - History of total hysterectomy - No longer requires Pap smears  Allergic rhinitis - History of seasonal allergies - Uses Flonase  as needed       Screenings:  Colon Cancer: UTD Lung Cancer: NA Breast Cancer: Needs to be  completed  Diabetes: Checking A1c with labs  HLD: Checking lipid with labs  The 10-year ASCVD risk score (Arnett DK, et al., 2019) is: 11.1%   Outpatient Encounter Medications as of 04/02/2024  Medication Sig   albuterol  (VENTOLIN  HFA) 108 (90 Base) MCG/ACT inhaler Inhale 2 puffs into the lungs every 4 (four) hours as needed.   benzonatate (TESSALON) 200 MG capsule Take 200 mg by mouth 3 (three) times daily as needed for cough.   Flaxseed, Linseed, (FLAXSEED OIL) 1000 MG CAPS Take by mouth 2 (two) times daily.   pantoprazole  (PROTONIX ) 40 MG tablet Take 1 tablet (40 mg total) by mouth daily. TAKE ONE TABLET BY MOUTH ONCE DAILY 30 MINUTES  PRIOR TO YOUR FIRST MEAL FOREVER   [DISCONTINUED] atorvastatin  (LIPITOR) 10 MG tablet Take 1 tablet by mouth daily.   [DISCONTINUED] canagliflozin  (INVOKANA ) 100 MG TABS tablet Take by mouth daily before breakfast.   [DISCONTINUED] doxycycline  (VIBRAMYCIN ) 100 MG capsule Take 1 capsule (100 mg total) by mouth 2 (two) times daily.   [DISCONTINUED] glimepiride  (AMARYL ) 4 MG tablet Take 1 tablet by mouth 2 (two) times daily.    [DISCONTINUED] levocetirizine (XYZAL ) 5 MG tablet 1 tablet in the evening Orally Once a day for 30 days   [DISCONTINUED] levothyroxine  (SYNTHROID , LEVOTHROID) 50 MCG tablet Take 50 mcg by mouth daily.    [DISCONTINUED] lisinopril  (PRINIVIL ,ZESTRIL ) 2.5 MG tablet Take 2.5 mg by mouth daily.    [DISCONTINUED] sitaGLIPtin  (JANUVIA ) 100 MG tablet Take 100 mg by mouth daily.   [DISCONTINUED] topiramate  (TOPAMAX ) 100 MG tablet Take 100 mg by mouth at bedtime.   [  DISCONTINUED] traZODone  (DESYREL ) 50 MG tablet Take 150 mg by mouth at bedtime.    atorvastatin  (LIPITOR) 10 MG tablet Take 1 tablet (10 mg total) by mouth daily.   canagliflozin  (INVOKANA ) 100 MG TABS tablet Take 1 tablet (100 mg total) by mouth daily before breakfast.   glimepiride  (AMARYL ) 4 MG tablet Take 1 tablet (4 mg total) by mouth 2 (two) times daily.   levocetirizine (XYZAL )  5 MG tablet Take 1 tablet (5 mg total) by mouth every evening.   levothyroxine  (SYNTHROID ) 50 MCG tablet Take 1 tablet (50 mcg total) by mouth daily.   lisinopril  (ZESTRIL ) 2.5 MG tablet Take 1 tablet (2.5 mg total) by mouth daily.   sitaGLIPtin  (JANUVIA ) 100 MG tablet Take 1 tablet (100 mg total) by mouth daily.   topiramate  (TOPAMAX ) 100 MG tablet Take 1 tablet (100 mg total) by mouth at bedtime.   traZODone  (DESYREL ) 50 MG tablet Take 2 tablets (100 mg total) by mouth at bedtime.   [DISCONTINUED] meloxicam (MOBIC) 15 MG tablet Take 15 mg by mouth daily. (Patient not taking: Reported on 04/02/2024)   [DISCONTINUED] predniSONE  (DELTASONE ) 20 MG tablet Take 2 tablets (40 mg total) by mouth daily with breakfast. (Patient not taking: Reported on 04/02/2024)   No facility-administered encounter medications on file as of 04/02/2024.    Past Medical History:  Diagnosis Date   Diabetes mellitus without complication (HCC)    Hypertension    Migraine    Thyroid  disease     Past Surgical History:  Procedure Laterality Date   ABDOMINAL HYSTERECTOMY     BALLOON DILATION N/A 09/17/2021   Procedure: BALLOON DILATION;  Surgeon: Cindie Carlin POUR, DO;  Location: [facility];  Service: Endoscopy;  Laterality: N/A;   BIOPSY  09/17/2021   Procedure: BIOPSY;  Surgeon: Cindie Carlin POUR, DO;  Location: [facility];  Service: Endoscopy;;   CHOLECYSTECTOMY     COLONOSCOPY N/A 08/16/2019   Procedure: COLONOSCOPY;  Surgeon: Harvey Margo CROME, MD;  Location: [facility];  Service: Endoscopy;  Laterality: N/A;  1:00pm   ESOPHAGOGASTRODUODENOSCOPY N/A 10/31/2014   DOQ:dfjoo HH/mild non-erosive gastritis/dysphagia due to stricture   ESOPHAGOGASTRODUODENOSCOPY (EGD) WITH PROPOFOL  N/A 09/17/2021   Procedure: ESOPHAGOGASTRODUODENOSCOPY (EGD) WITH PROPOFOL ;  Surgeon: Cindie Carlin POUR, DO;  Location: [facility];  Service: Endoscopy;  Laterality: N/A;  12:00pm   SAVORY DILATION N/A 10/31/2014   Procedure: SAVORY  DILATION;  Surgeon: Margo CROME Harvey, MD;  Location: [facility];  Service: Endoscopy;  Laterality: N/A;    Family History  Problem Relation Age of Onset   Pancreatic cancer Father    Thyroid  cancer Cousin    Colon cancer Neg Hx     Social History   Socioeconomic History   Marital status: Divorced    Spouse name: Not on file   Number of children: Not on file   Years of education: Not on file   Highest education level: Not on file  Occupational History   Not on file  Tobacco Use   Smoking status: Former   Smokeless tobacco: Never   Tobacco comments:    quit 2004  Substance and Sexual Activity   Alcohol use: No   Drug use: No   Sexual activity: Yes    Birth control/protection: None  Other Topics Concern   Not on file  Social History Narrative   Not on file   Social Drivers of Health   Tobacco Use: Medium Risk (04/02/2024)   Patient History  Smoking Tobacco Use: Former    Smokeless Tobacco Use: Never    Passive Exposure: Not on Actuary Strain: Not on file  Food Insecurity: Not on file  Transportation Needs: Not on file  Physical Activity: Not on file  Stress: Not on file  Social Connections: Not on file  Intimate Partner Violence: Not on file  Depression (PHQ2-9): Medium Risk (04/02/2024)   Depression (PHQ2-9)    PHQ-2 Score: 5  Alcohol Screen: Not on file  Housing: Not on file  Utilities: Not on file  Health Literacy: Not on file    ROS  Per HPI      Objective    BP 111/83   Pulse 100   Temp 97.8 F (36.6 C) (Oral)   Ht 5' 4 (1.626 m)   Wt 149 lb (67.6 kg)   SpO2 100%   BMI 25.58 kg/m   Physical Exam Constitutional:      General: She is not in acute distress.    Appearance: Normal appearance.  Cardiovascular:     Rate and Rhythm: Normal rate and regular rhythm.     Heart sounds: Normal heart sounds. No murmur heard.    No friction rub. No gallop.  Pulmonary:     Effort: Pulmonary effort is normal. No respiratory  distress.     Breath sounds: Normal breath sounds.  Abdominal:     General: Bowel sounds are normal.     Palpations: Abdomen is soft.  Musculoskeletal:        General: No swelling.     Cervical back: Neck supple.  Lymphadenopathy:     Cervical: No cervical adenopathy.  Skin:    General: Skin is warm and dry.  Neurological:     General: No focal deficit present.     Mental Status: She is alert.  Psychiatric:        Mood and Affect: Mood normal.        Behavior: Behavior normal.        Thought Content: Thought content normal.         Assessment & Plan:   Controlled type 2 diabetes mellitus without complication, without long-term current use of insulin (HCC) -     POCT glycosylated hemoglobin (Hb A1C) -     Microalbumin / creatinine urine ratio -     POCT UA - Microalbumin  Migraine without aura and without status migrainosus, not intractable Assessment & Plan: Managed with Topamax  daily for prevention, effective.  - Refilled Topamax .  Orders: -     Topiramate ; Take 1 tablet (100 mg total) by mouth at bedtime.  Dispense: 90 tablet; Refill: 2  Insomnia, unspecified type Assessment & Plan: Difficulty falling asleep, managed with trazodone . - Refilled trazodone .  Orders: -     traZODone  HCl; Take 2 tablets (100 mg total) by mouth at bedtime.  Dispense: 180 tablet; Refill: 2  Hyperlipidemia associated with type 2 diabetes mellitus (HCC) -     Atorvastatin  Calcium ; Take 1 tablet (10 mg total) by mouth daily.  Dispense: 90 tablet; Refill: 2 -     Lipid panel; Future -     Comprehensive metabolic panel with GFR; Future  Type 2 diabetes mellitus without complication, without long-term current use of insulin (HCC) Assessment & Plan: A1c elevated at 8.2 due to running out of medication. No medication changes until A1c re-evaluated. - Refilled glimepiride , sitagliptin , and Invokana . - Advised to resume medication adherence.   Orders: -     SITagliptin  Phosphate;  Take 1  tablet (100 mg total) by mouth daily.  Dispense: 90 tablet; Refill: 2 -     Glimepiride ; Take 1 tablet (4 mg total) by mouth 2 (two) times daily.  Dispense: 180 tablet; Refill: 2 -     Invokana ; Take 1 tablet (100 mg total) by mouth daily before breakfast.  Dispense: 90 tablet; Refill: 2  Hypertension associated with diabetes (HCC) Assessment & Plan: BP Goal < 130/80. BP stable and at goal. Continue lisinopril  2.5 mg daily. Collecting CMP with labs.   Orders: -     Lisinopril ; Take 1 tablet (2.5 mg total) by mouth daily.  Dispense: 90 tablet; Refill: 2 -     CBC with Differential/Platelet; Future  Hypothyroidism, unspecified type Assessment & Plan: On levothyroxine . No recent thyroid  function tests. Will update today  - Ordered thyroid  function tests including TSH and other thyroid  hormones. - Continue levothyroxine  50 mcg daily   Orders: -     Levothyroxine  Sodium; Take 1 tablet (50 mcg total) by mouth daily.  Dispense: 90 tablet; Refill: 2 -     Thyroid  Panel With TSH; Future  Allergic rhinitis due to other allergic trigger, unspecified seasonality -     Levocetirizine Dihydrochloride ; Take 1 tablet (5 mg total) by mouth every evening.  Dispense: 90 tablet; Refill: 2  Encounter for vitamin deficiency screening -     VITAMIN D  25 Hydroxy (Vit-D Deficiency, Fractures); Future  Gastroesophageal reflux disease without esophagitis Assessment & Plan: Difficulty swallowing, possibly due to reflux and esophageal issues. Previous EGDs and throat stretching performed. - Advised to keep follow up with gastroenterologist regarding barium swallow study. - Advised smaller, more frequent meals for better digestion and less side effects.    Healthcare maintenance Assessment & Plan: Due for vaccinations and screenings. Last mammogram in 2022, colonoscopy in 2021, due for tetanus and shingles vaccines. - Advised to obtain shingles and tetanus vaccines at pharmacy. - Advised to schedule  mammogram. - Advised to obtain last dose of pneumonia vaccine at pharmacy     Return in about 3 months (around 07/01/2024) for HTN, HLD, DM, thyroid .   Saddie JULIANNA Sacks, PA-C  "

## 2024-04-08 NOTE — Assessment & Plan Note (Signed)
 Due for vaccinations and screenings. Last mammogram in 2022, colonoscopy in 2021, due for tetanus and shingles vaccines. - Advised to obtain shingles and tetanus vaccines at pharmacy. - Advised to schedule mammogram. - Advised to obtain last dose of pneumonia vaccine at pharmacy

## 2024-05-09 ENCOUNTER — Telehealth: Payer: Self-pay | Admitting: *Deleted

## 2024-05-09 ENCOUNTER — Ambulatory Visit: Admitting: Gastroenterology

## 2024-05-09 NOTE — Telephone Encounter (Signed)
 Copied from CRM [id number]. Topic: Clinical - Medication Refill >> May 09, 2024  2:05 PM Delon HERO wrote: Medication: levocetirizine (XYZAL ) 5 MG tablet [[id number]]  Has the patient contacted their pharmacy? Yes (Agent: If no, request that the patient contact the pharmacy for the refill. If patient does not wish to contact the pharmacy document the reason why and proceed with request.) (Agent: If yes, when and what did the pharmacy advise?)  This is the patient's preferred pharmacy:  [facility] [address] [place] - [address] [place] [address] Phone: [phone] Fax: [phone]  Is this the correct pharmacy for this prescription? Yes If no, delete pharmacy and type the correct one.   Has the prescription been filled recently? Yes  Is the patient out of the medication? Yes  Has the patient been seen for an appointment in the last year OR does the patient have an upcoming appointment? Yes  Can we respond through MyChart? Yes  Agent: Please be advised that Rx refills may take up to 3 business days. We ask that you follow-up with your pharmacy.

## 2024-05-09 NOTE — Telephone Encounter (Signed)
 Contacted pt and informed her that his was sent in in December and that the pharmacy would be able to fill it.

## 2024-05-30 ENCOUNTER — Ambulatory Visit: Admitting: Gastroenterology
